# Patient Record
Sex: Female | Born: 1995 | Race: Black or African American | Hispanic: No | Smoking: Never smoker
Health system: Southern US, Community
[De-identification: ages and names within clinical notes are randomized; demographics above are authoritative.]

## PROBLEM LIST (undated history)

## (undated) DIAGNOSIS — E119 Type 2 diabetes mellitus without complications: Secondary | ICD-10-CM

## (undated) DIAGNOSIS — T7840XA Allergy, unspecified, initial encounter: Secondary | ICD-10-CM

## (undated) HISTORY — DX: Allergy, unspecified, initial encounter: T78.40XA

## (undated) HISTORY — PX: ADENOIDECTOMY: SHX5191

## (undated) HISTORY — DX: Type 2 diabetes mellitus without complications: E11.9

---
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
## 2013-10-31 ENCOUNTER — Ambulatory Visit: Payer: Medicaid Other | Attending: Internal Medicine | Admitting: Internal Medicine

## 2013-10-31 ENCOUNTER — Encounter: Payer: Self-pay | Admitting: Internal Medicine

## 2013-10-31 VITALS — BP 133/92 | HR 100 | Temp 98.3°F | Resp 15 | Ht 64.0 in | Wt 271.0 lb

## 2013-10-31 DIAGNOSIS — R030 Elevated blood-pressure reading, without diagnosis of hypertension: Secondary | ICD-10-CM | POA: Insufficient documentation

## 2013-10-31 DIAGNOSIS — N926 Irregular menstruation, unspecified: Secondary | ICD-10-CM | POA: Insufficient documentation

## 2013-10-31 DIAGNOSIS — IMO0001 Reserved for inherently not codable concepts without codable children: Secondary | ICD-10-CM

## 2013-10-31 DIAGNOSIS — E1159 Type 2 diabetes mellitus with other circulatory complications: Secondary | ICD-10-CM | POA: Insufficient documentation

## 2013-10-31 LAB — POCT GLYCOSYLATED HEMOGLOBIN (HGB A1C): Hemoglobin A1C: 6.9

## 2013-10-31 NOTE — Patient Instructions (Signed)
Obesity Obesity is defined as having too much total body fat and a body mass index (BMI) of 30 or more. BMI is an estimate of body fat and is calculated from your height and weight. Obesity happens when you consume more calories than you can burn by exercising or performing daily physical tasks. Prolonged obesity can cause major illnesses or emergencies, such as:   A stroke.  Heart disease.  Diabetes.  Cancer.  Arthritis.  High blood pressure (hypertension).  High cholesterol.  Sleep apnea.  Erectile dysfunction.  Infertility problems. CAUSES   Regularly eating unhealthy foods.  Physical inactivity.  Certain disorders, such as an underactive thyroid (hypothyroidism), Cushing's syndrome, and polycystic ovarian syndrome.  Certain medicines, such as steroids, some depression medicines, and antipsychotics.  Genetics.  Lack of sleep. DIAGNOSIS  A caregiver can diagnose obesity after calculating your BMI. Obesity will be diagnosed if your BMI is 30 or higher.  There are other methods of measuring obesity levels. Some other methods include measuring your skin fold thickness, your waist circumference, and comparing your hip circumference to your waist circumference. TREATMENT  A healthy treatment program includes some or all of the following:  Long-term dietary changes.  Exercise and physical activity.  Behavioral and lifestyle changes.  Medicine only under the supervision of your caregiver. Medicines may help, but only if they are used with diet and exercise programs. An unhealthy treatment program includes:  Fasting.  Fad diets.  Supplements and drugs. These choices do not succeed in long-term weight control.  HOME CARE INSTRUCTIONS   Exercise and perform physical activity as directed by your caregiver. To increase physical activity, try the following:  Use stairs instead of elevators.  Park farther away from store entrances.  Garden, bike, or walk instead of  watching television or using the computer.  Eat healthy, low-calorie foods and drinks on a regular basis. Eat more fruits and vegetables. Use low-calorie cookbooks or take healthy cooking classes.  Limit fast food, sweets, and processed snack foods.  Eat smaller portions.  Keep a daily journal of everything you eat. There are many free websites to help you with this. It may be helpful to measure your foods so you can determine if you are eating the correct portion sizes.  Avoid drinking alcohol. Drink more water and drinks without calories.  Take vitamins and supplements only as recommended by your caregiver.  Weight-loss support groups, Registered Dieticians, counselors, and stress reduction education can also be very helpful. SEEK IMMEDIATE MEDICAL CARE IF:  You have chest pain or tightness.  You have trouble breathing or feel short of breath.  You have weakness or leg numbness.  You feel confused or have trouble talking.  You have sudden changes in your vision. MAKE SURE YOU:  Understand these instructions.  Will watch your condition.  Will get help right away if you are not doing well or get worse. Document Released: 08/25/2004 Document Revised: 01/17/2012 Document Reviewed: 08/24/2011 ExitCare Patient Information 2014 [employer].  

## 2013-10-31 NOTE — Progress Notes (Signed)
Pt is here to establish care. Pt is requesting a full check up.

## 2013-11-01 ENCOUNTER — Encounter: Payer: Self-pay | Admitting: Internal Medicine

## 2013-11-01 LAB — LIPID PANEL
CHOLESTEROL: 110 mg/dL (ref 0–200)
HDL: 33 mg/dL — ABNORMAL LOW (ref >39)
LDL Cholesterol: 49 mg/dL (ref 0–99)
TRIGLYCERIDES: 138 mg/dL (ref <150)
Total CHOL/HDL Ratio: 3.3 Ratio
VLDL: 28 mg/dL (ref 0–40)

## 2013-11-01 LAB — URINALYSIS, COMPLETE
BACTERIA UA: NONE SEEN
Bilirubin Urine: NEGATIVE
CRYSTALS: NONE SEEN
Casts: NONE SEEN
GLUCOSE, UA: NEGATIVE mg/dL
KETONES UR: NEGATIVE mg/dL
Nitrite: NEGATIVE
PROTEIN: NEGATIVE mg/dL
SQUAMOUS EPITHELIAL / LPF: NONE SEEN
Specific Gravity, Urine: <1.005 — ABNORMAL LOW (ref 1.005–1.030)
Urobilinogen, UA: 0.2 mg/dL (ref 0.0–1.0)
pH: 6.5 (ref 5.0–8.0)

## 2013-11-01 LAB — COMPLETE METABOLIC PANEL WITH GFR
ALK PHOS: 63 U/L (ref 39–117)
ALT: 18 U/L (ref 0–35)
AST: 21 U/L (ref 0–37)
Albumin: 4.1 g/dL (ref 3.5–5.2)
BILIRUBIN TOTAL: 0.3 mg/dL (ref 0.2–1.1)
BUN: 8 mg/dL (ref 6–23)
CO2: 25 meq/L (ref 19–32)
CREATININE: 0.57 mg/dL (ref 0.50–1.10)
Calcium: 9.7 mg/dL (ref 8.4–10.5)
Chloride: 101 mEq/L (ref 96–112)
GFR, Est Non African American: >89 mL/min
Glucose, Bld: 125 mg/dL — ABNORMAL HIGH (ref 70–99)
Potassium: 4.3 mEq/L (ref 3.5–5.3)
SODIUM: 135 meq/L (ref 135–145)
TOTAL PROTEIN: 7.7 g/dL (ref 6.0–8.3)

## 2013-11-01 LAB — CBC WITH DIFFERENTIAL/PLATELET
BASOS ABS: 0 10*3/uL (ref 0.0–0.1)
BASOS PCT: 0 % (ref 0–1)
EOS PCT: 2 % (ref 0–5)
Eosinophils Absolute: 0.2 10*3/uL (ref 0.0–0.7)
HCT: 36.6 % (ref 36.0–46.0)
Hemoglobin: 12.4 g/dL (ref 12.0–15.0)
LYMPHS ABS: 2.8 10*3/uL (ref 0.7–4.0)
Lymphocytes Relative: 34 % (ref 12–46)
MCH: 23.2 pg — ABNORMAL LOW (ref 26.0–34.0)
MCHC: 33.9 g/dL (ref 30.0–36.0)
MCV: 68.5 fL — ABNORMAL LOW (ref 78.0–100.0)
Monocytes Absolute: 0.8 10*3/uL (ref 0.1–1.0)
Monocytes Relative: 10 % (ref 3–12)
NEUTROS PCT: 54 % (ref 43–77)
Neutro Abs: 4.5 10*3/uL (ref 1.7–7.7)
PLATELETS: 388 10*3/uL (ref 150–400)
RBC: 5.34 MIL/uL — AB (ref 3.87–5.11)
RDW: 15.7 % — AB (ref 11.5–15.5)
WBC: 8.3 10*3/uL (ref 4.0–10.5)

## 2013-11-01 LAB — TSH: TSH: 1.898 u[IU]/mL (ref 0.350–4.500)

## 2013-11-01 NOTE — Progress Notes (Signed)
Patient ID: Sharon Wagner, female   DOB: Dec 06, 1995, 18 y.o.   MRN: [id number]   Sharon Wagner, is a 18 y.o. female  [id number]  [id number]  DOB - Dec 06, 1995  CC:  Chief Complaint  Patient presents with  . Establish Care       HPI: Sharon Wagner is a 18 y.o. female here today to establish medical care. She was brought in to clinic today by her father who is a single parent, patient's mother died when she was 89 years old. Father wants her tested for diabetes and other medical conditions. She has no significant past medical history. Father is hypertensive and diabetic. She reports no concern except that she is overweight and she would like to lose weight. She does not know her menstrual history, according to the father she wears pad all the time saying just in case her menstrual period comes. She will occasionally see spotting, sometimes it's menstrual period, sometimes she sees this pattern twice in a month. She's not sexually active. She does not smoke cigarette and she does not drink alcohol. Patient has No headache, No chest pain, No abdominal pain - No Nausea, No new weakness tingling or numbness, No Cough - SOB.  No Known Allergies History reviewed. No pertinent past medical history. No current outpatient prescriptions on file prior to visit.   No current facility-administered medications on file prior to visit.   Family History  Problem Relation Age of Onset  . Diabetes Father   . Heart disease Father   . Hypertension Father   . Diabetes Paternal Grandmother   . Heart disease Paternal Grandfather    History   Social History  . Marital Status: Single    Spouse Name: N/A    Number of Children: N/A  . Years of Education: N/A   Occupational History  . Not on file.   Social History Main Topics  . Smoking status: Never Smoker   . Smokeless tobacco: Not on file  . Alcohol Use: No  . Drug Use: No  . Sexual Activity: No   Other Topics Concern  . Not on  file   Social History Narrative  . No narrative on file    Review of Systems: Constitutional: Negative for fever, chills, diaphoresis, activity change, appetite change and fatigue. HENT: Negative for ear pain, nosebleeds, congestion, facial swelling, rhinorrhea, neck pain, neck stiffness and ear discharge.  Eyes: Negative for pain, discharge, redness, itching and visual disturbance. Respiratory: Negative for cough, choking, chest tightness, shortness of breath, wheezing and stridor.  Cardiovascular: Negative for chest pain, palpitations and leg swelling. Gastrointestinal: Negative for abdominal distention. Genitourinary: Negative for dysuria, urgency, frequency, hematuria, flank pain, decreased urine volume, difficulty urinating and dyspareunia.  Musculoskeletal: Negative for back pain, joint swelling, arthralgia and gait problem. Neurological: Negative for dizziness, tremors, seizures, syncope, facial asymmetry, speech difficulty, weakness, light-headedness, numbness and headaches.  Hematological: Negative for adenopathy. Does not bruise/bleed easily. Psychiatric/Behavioral: Negative for hallucinations, behavioral problems, confusion, dysphoric mood, decreased concentration and agitation.    Objective:   Filed Vitals:   10/31/13 1511  BP: 133/92  Pulse:   Temp:   Resp:     Physical Exam: Constitutional: Patient appears well-developed and well-nourished. No distress. Morbidly obese HENT: Normocephalic, atraumatic, External right and left ear normal. Oropharynx is clear and moist.  Eyes: Conjunctivae and EOM are normal. PERRLA, no scleral icterus. Neck: Normal ROM. Neck supple. No JVD. No tracheal deviation. No thyromegaly. CVS: RRR, S1/S2 +, no murmurs, no gallops, no  carotid bruit.  Pulmonary: Effort and breath sounds normal, no stridor, rhonchi, wheezes, rales.  Abdominal: Soft. BS +, no distension, tenderness, rebound or guarding.  Musculoskeletal: Normal range of motion. No  edema and no tenderness.  Lymphadenopathy: No lymphadenopathy noted, cervical, inguinal or axillary Neuro: Alert. Normal reflexes, muscle tone coordination. No cranial nerve deficit. Skin: Skin is warm and dry. No rash noted. Not diaphoretic. No erythema. No pallor. Psychiatric: Normal mood and affect. Behavior, judgment, thought content normal.  Lab Results  Component Value Date   WBC 8.3 10/31/2013   HGB 12.4 10/31/2013   HCT 36.6 10/31/2013   MCV 68.5* 10/31/2013   PLT 388 10/31/2013   Lab Results  Component Value Date   CREATININE 0.57 10/31/2013   BUN 8 10/31/2013   NA 135 10/31/2013   K 4.3 10/31/2013   CL 101 10/31/2013   CO2 25 10/31/2013    Lab Results  Component Value Date   HGBA1C 6.9 10/31/2013   Lipid Panel     Component Value Date/Time   CHOL 110 10/31/2013 1449   TRIG 138 10/31/2013 1449   HDL 33* 10/31/2013 1449   CHOLHDL 3.3 10/31/2013 1449   VLDL 28 10/31/2013 1449   LDLCALC 49 10/31/2013 1449       Assessment and plan:   1. Elevated blood pressure Repeat blood pressure is 133/92 mmHg Patient has been instructed to record ambulatory blood pressure at least 3 times a week and bring to the next visit DASH diet The patient was counseled extensively about nutrition and exercise  2. Morbid obesity  - CBC with Differential - COMPLETE METABOLIC PANEL WITH GFR - POCT glycosylated hemoglobin (Hb A1C) - Lipid panel - TSH - Urinalysis, Complete  3. Irregular menstrual cycle Patient was given instruction to record her menstrual period from now on and bring to the next visit   Return in about 2 months (around 12/31/2013), or if symptoms worsen or fail to improve, for BP Check, Nurse Visit.  The patient was given clear instructions to go to ER or return to medical center if symptoms don't improve, worsen or new problems develop. The patient verbalized understanding. The patient was told to call to get lab results if they haven't heard anything in the next week.     This note has been  created with Education officer, environmental. Any transcriptional errors are unintentional.    Jeanann Lewandowsky, MD, MHA, FACP, FAAP [facility] [place], [place] [phone]   11/01/2013, 1:45 PM

## 2013-11-07 ENCOUNTER — Telehealth: Payer: Self-pay | Admitting: Emergency Medicine

## 2013-11-07 NOTE — Telephone Encounter (Signed)
Message copied by Darlis LoanSMITH, JILL D on Thu Nov 07, 2013 10:06 AM ------      Message from: Jeanann LewandowskyJEGEDE, OLUGBEMIGA E      Created: Wed Nov 06, 2013  3:11 PM       Please inform patient that her hemoglobin A1c is 6.9% which shows that she is diabetic. All other tests are mostly within normal limits      Requires that she comes to the clinic for diabetic instructions and medication. She can do this as a walk-in during this week ------

## 2013-11-07 NOTE — Telephone Encounter (Signed)
Pt and father informed of new diagnosis of diabetes Scheduled pt for nurse visit 11/13/13 @ 2pm

## 2013-11-07 NOTE — Telephone Encounter (Signed)
Left message for pt to call clinic to come in as walk in for new diabetes education with medication

## 2013-11-07 NOTE — Telephone Encounter (Signed)
Message copied by Darlis LoanSMITH, Jeremi Losito D on Thu Nov 07, 2013  3:19 PM ------      Message from: Jeanann LewandowskyJEGEDE, OLUGBEMIGA E      Created: Wed Nov 06, 2013  3:11 PM       Please inform patient that her hemoglobin A1c is 6.9% which shows that she is diabetic. All other tests are mostly within normal limits      Requires that she comes to the clinic for diabetic instructions and medication. She can do this as a walk-in during this week ------

## 2013-11-07 NOTE — Telephone Encounter (Signed)
Left message #2 for pt to call when received message

## 2013-11-13 ENCOUNTER — Ambulatory Visit: Payer: Medicaid Other | Attending: Internal Medicine | Admitting: Pharmacist

## 2013-11-13 ENCOUNTER — Encounter: Payer: Self-pay | Admitting: Pharmacist

## 2013-11-13 VITALS — BP 118/74 | HR 99 | Temp 98.5°F | Ht 64.0 in | Wt 266.8 lb

## 2013-11-13 DIAGNOSIS — E119 Type 2 diabetes mellitus without complications: Secondary | ICD-10-CM

## 2013-11-13 LAB — GLUCOSE, POCT (MANUAL RESULT ENTRY): POC Glucose: 110 mg/dl — AB (ref 70–99)

## 2013-11-13 MED ORDER — METFORMIN HCL 500 MG PO TABS: 500.0000 mg | ORAL_TABLET | Freq: Two times a day (BID) | ORAL | Status: DC

## 2013-11-13 MED ORDER — ACCU-CHEK AVIVA PLUS W/DEVICE KIT: PACK | Status: DC

## 2013-11-13 MED ORDER — GLUCOSE BLOOD VI STRP: ORAL_STRIP | Status: DC

## 2013-11-13 MED ORDER — ACCU-CHEK SOFTCLIX LANCETS MISC: Status: DC

## 2013-11-13 NOTE — Progress Notes (Unsigned)
S:    Patient arrives with father for diabetes followup.  She was diagnosed last week with diabetes. Patient has taken no medication at this time.   O:  . Lab Results  Component Value Date   HGBA1C 6.9 10/31/2013     home fasting CBG readings of N/A 2 hour post-prandial/random CBG readings of  N/A CBG in office: 110   A/P: Diabetes: Today we discussed what her A1c value meant.  She was also shown how to use a meter and test her blood sugars.  She was given a log book to track her blood sugars each day.   Referral was sent for diabetes education class.  Blood Pressure: BP was a goal today.   1.  Pt started on Metformin 500 mg BID and counseled on side effects. 2. Pt to check blood sugars twice daily.  Rx for meter and supplies sent to pharmacy. 3.  Will follow up with pt in 2 weeks to review blood sugar readings.

## 2013-11-26 ENCOUNTER — Ambulatory Visit: Payer: Medicaid Other | Admitting: Pharmacist

## 2014-01-01 ENCOUNTER — Encounter: Payer: Medicaid Other | Attending: Internal Medicine | Admitting: *Deleted

## 2014-01-01 ENCOUNTER — Encounter: Payer: Self-pay | Admitting: *Deleted

## 2014-01-01 VITALS — Ht 66.0 in | Wt 263.6 lb

## 2014-01-01 DIAGNOSIS — E119 Type 2 diabetes mellitus without complications: Secondary | ICD-10-CM | POA: Insufficient documentation

## 2014-01-01 DIAGNOSIS — Z713 Dietary counseling and surveillance: Secondary | ICD-10-CM | POA: Insufficient documentation

## 2014-01-01 NOTE — Progress Notes (Signed)
Appt start time: 1400 end time:  1530.   Assessment:  Patient was seen on  01/01/14 for individual diabetes education. She is accompanied by her father. She lives with her father, Step-mother and step-sister. She works at [employer] on [employer], [employer] and utilizes the [employer] for transportation. She is 18 yo with diminished mental capacity. She goes to the [employer] with her family Melvyn Novas. Much of the education was directed at her father.  Current HbA1c: 6.9%  Preferred Learning Style:   No preference indicated   Learning Readiness:   Ready  MEDICATIONS: Metformin  DIETARY INTAKE:  B ( AM): sausage, egg, bacon, dry cereal, bagel strawberry cheese  Snk ( AM): none  L ( PM): [employer]..chicken strips, chicken sandwich Snk ( PM): chips D ( PM): spaghetti with sauce, cheese, ranch dressing Snk ( PM): none Beverages: water, regular sprite, fruit punch, sweet Tea  Usual physical activity: Goes to [employer] 2 X weekly, swim, walk, treadmill, bicycle  Intervention:  Nutrition counseling provided.  Discussed diabetes disease process and treatment options.  Discussed physiology of diabetes and role of obesity on insulin resistance.  Encouraged moderate weight reduction to improve glucose levels.  Discussed role of medications and diet in glucose control  Provided education on macronutrients on glucose levels.  Provided education on carb counting, importance of regularly scheduled meals/snacks, and meal planning  Discussed effects of physical activity on glucose levels and long-term glucose control.  Recommended 150 minutes of physical activity/week.  Reviewed patient medications.  Discussed role of medication on blood glucose and possible side effects  Discussed blood glucose monitoring and interpretation.  Discussed recommended target ranges and individual ranges.    Described short-term complications: hyper- and hypo-glycemia.  Discussed causes,symptoms, and  treatment options.  Discussed prevention, detection, and treatment of long-term complications.  Discussed the role of prolonged elevated glucose levels on body systems.  Discussed role of stress on blood glucose levels and discussed strategies to manage psychosocial issues.  Discussed recommendations for long-term diabetes self-care.  Established checklist for medical, dental, and emotional self-care.  Plan:  Aim for 3 Carb Choices per meal (45 grams) +/- 1 either way  Aim for 0-1 Carbs per snack if hungry  Include protein in moderation with your meals and snacks Consider reading food labels for Total Carbohydrate and Fat Grams of foods Consider  increasing your activity level by going to the [employer] 5 times per week for at least 30 minutes daily as tolerated Continue checking BG at alternate times per day to include fasting and 2 hours after dinner as directed by MD  Continue taking medication as directed by MD  Weigh yourself the same day each week, first thing in the morning with no clothes on. Decrease the bagels - too much sugar Switch to diet soda Switch to TEA sweetened with Splenda  Teaching Method Utilized:  Visual Auditory Hands on  Handouts given during visit include: Meal Plan Card My Plate  Barriers to learning/adherence to lifestyle change: diminished mental capacity  Diabetes self-care support plan:   [facility] support group  family  Demonstrated degree of understanding via:  Teach Back   Monitoring/Evaluation:  Dietary intake, exercise, test glucose, and body weight in 4 weeks with dietitian.

## 2014-01-01 NOTE — Patient Instructions (Addendum)
Plan:  Aim for 3 Carb Choices per meal (45 grams) +/- 1 either way  Aim for 0-1 Carbs per snack if hungry  Include protein in moderation with your meals and snacks Consider reading food labels for Total Carbohydrate and Fat Grams of foods Consider  increasing your activity level by going to [facility] [employer] 5 times per week for at least 30 minutes daily as tolerated Continue checking BG at alternate times per day to include fasting and 2 hours after dinner as directed by MD  Continue taking medication as directed by MD  Weigh yourself the same day each week, first thing in the morning with no clothes on. Decrease the bagels - too much sugar Switch to diet soda Switch to TEA sweetened with Splenda

## 2014-01-07 ENCOUNTER — Encounter: Payer: Self-pay | Admitting: *Deleted

## 2014-01-07 NOTE — Progress Notes (Signed)
Fax received from [employer] in regards to Medicare Part B detailed written order for Accu-check avia plus test strips and Accu-check Softclix Lancets. Forms placed in Dr. Louis Meckel box to be filled out and returned to Mariana Single, Charity fundraiser. Reather Laurence, RN

## 2014-01-13 ENCOUNTER — Ambulatory Visit: Payer: Medicaid Other | Admitting: Internal Medicine

## 2014-02-18 ENCOUNTER — Ambulatory Visit: Payer: Medicaid Other | Admitting: Dietician

## 2014-03-04 ENCOUNTER — Encounter: Payer: Medicaid Other | Attending: Internal Medicine | Admitting: Dietician

## 2014-03-04 VITALS — Ht 66.0 in | Wt 260.4 lb

## 2014-03-04 DIAGNOSIS — E119 Type 2 diabetes mellitus without complications: Secondary | ICD-10-CM | POA: Diagnosis not present

## 2014-03-04 DIAGNOSIS — Z713 Dietary counseling and surveillance: Secondary | ICD-10-CM | POA: Insufficient documentation

## 2014-03-04 NOTE — Patient Instructions (Addendum)
Plan:  Aim for 3 Carb Choices per meal (45 grams) +/- 1 either way  Aim for 0-1 Carbs per snack if hungry  Include protein in moderation with your meals and snacks Consider reading food labels for Total Carbohydrate and Fat Grams of foods Consider  increasing your activity level by going to [facility] [employer] 5 times per week for at least 30 minutes daily as tolerated Continue checking BG at alternate times per day to include fasting and 2 hours after dinner as directed by MD   Weigh yourself the same day each week, first thing in the morning with no clothes on. Decrease the bagels - too much sugar Switch to diet soda Switch to TEA sweetened with Splenda   -Increase non-starchy vegetable intake: squash, zucchini, collard greens, onions, peppers -Have 1/2 Twister drink per day (or less!) -Choose snacks with a carb and a protein

## 2014-03-04 NOTE — Progress Notes (Signed)
Appt start time: 230 end time:  300.  Follow-up: Sharon FoundsKadijah returns today with her father. She has lost 4 pounds since last visit 2 months ago. However, her father reports that at one point she was 248 lbs. She has her log book with her today and blood sugars have been good, averaging 90-135 mg/dL. She had a few readings in the 180's and attributes this to too many carbohydrates. No current HgbA1c available.  Preferred Learning Style:   No preference indicated   Learning Readiness:   Ready  MEDICATIONS: Metformin  DIETARY INTAKE:  B ( AM): chicken patty with Bo rounds and water Snk ( AM): none  L ( PM): Hamburger Snk ( PM): honeybun or brownie D ( PM): hamburger on bun with cheese and ketchup with chips Snk ( PM): none  Beverages: water, fruit punch  Usual physical activity: [facility] [employer] [facility], swim, walk, treadmill, bicycle  Intervention:  Nutrition counseling provided.  Reviewed effect of macronutrients on glucose levels.   Practiced carbohydrate counting.  Encouraged snacks with 1 carb and 1 protein  Teaching Method Utilized:  Visual Auditory Hands on  Handouts given during visit include: 15g CHO + protein snacks  Barriers to learning/adherence to lifestyle change: diminished mental capacity  Diabetes self-care support plan:   [facility] support group  family  Demonstrated degree of understanding via:  Teach Back   Monitoring/Evaluation:  Dietary intake, exercise, test glucose, and body weight in 8 weeks with dietitian.

## 2014-03-20 ENCOUNTER — Other Ambulatory Visit: Payer: Self-pay | Admitting: Internal Medicine

## 2014-05-06 ENCOUNTER — Ambulatory Visit: Payer: Medicaid Other | Admitting: Dietician

## 2014-10-01 ENCOUNTER — Other Ambulatory Visit: Payer: Self-pay | Admitting: Internal Medicine

## 2014-10-17 ENCOUNTER — Other Ambulatory Visit: Payer: Self-pay | Admitting: Internal Medicine

## 2014-11-27 ENCOUNTER — Other Ambulatory Visit (INDEPENDENT_AMBULATORY_CARE_PROVIDER_SITE_OTHER): Payer: Medicare Other

## 2014-11-27 ENCOUNTER — Encounter: Payer: Self-pay | Admitting: Family

## 2014-11-27 ENCOUNTER — Ambulatory Visit (INDEPENDENT_AMBULATORY_CARE_PROVIDER_SITE_OTHER): Payer: Medicare Other | Admitting: Family

## 2014-11-27 VITALS — BP 110/70 | HR 87 | Temp 98.0°F | Resp 18 | Ht 64.0 in | Wt 275.0 lb

## 2014-11-27 DIAGNOSIS — E119 Type 2 diabetes mellitus without complications: Secondary | ICD-10-CM

## 2014-11-27 DIAGNOSIS — Z0000 Encounter for general adult medical examination without abnormal findings: Secondary | ICD-10-CM

## 2014-11-27 LAB — LIPID PANEL
CHOLESTEROL: 105 mg/dL (ref 0–200)
HDL: 32.5 mg/dL — AB (ref >39.00)
LDL CALC: 49 mg/dL (ref 0–99)
NonHDL: 72.5
Total CHOL/HDL Ratio: 3
Triglycerides: 120 mg/dL (ref 0.0–149.0)
VLDL: 24 mg/dL (ref 0.0–40.0)

## 2014-11-27 LAB — CBC
HCT: 36.7 % (ref 36.0–46.0)
HEMOGLOBIN: 12.3 g/dL (ref 12.0–15.0)
MCHC: 33.6 g/dL (ref 30.0–36.0)
MCV: 68.4 fl — ABNORMAL LOW (ref 78.0–100.0)
PLATELETS: 404 10*3/uL — AB (ref 150.0–400.0)
RBC: 5.36 Mil/uL — AB (ref 3.87–5.11)
RDW: 15.3 % — ABNORMAL HIGH (ref 11.5–14.6)
WBC: 8 10*3/uL (ref 4.5–10.5)

## 2014-11-27 LAB — HEMOGLOBIN A1C: Hgb A1c MFr Bld: 7.3 % — ABNORMAL HIGH (ref 4.6–6.5)

## 2014-11-27 LAB — BASIC METABOLIC PANEL
BUN: 8 mg/dL (ref 6–23)
CHLORIDE: 102 meq/L (ref 96–112)
CO2: 27 mEq/L (ref 19–32)
CREATININE: 0.7 mg/dL (ref 0.40–1.20)
Calcium: 9.3 mg/dL (ref 8.4–10.5)
GFR: 136.22 mL/min (ref >60.00)
Glucose, Bld: 106 mg/dL — ABNORMAL HIGH (ref 70–99)
Potassium: 3.9 mEq/L (ref 3.5–5.1)
Sodium: 135 mEq/L (ref 135–145)

## 2014-11-27 LAB — GLUCOSE, POCT (MANUAL RESULT ENTRY): POC Glucose: 106 mg/dl — AB (ref 70–99)

## 2014-11-27 LAB — TSH: TSH: 8.23 u[IU]/mL — ABNORMAL HIGH (ref 0.35–5.50)

## 2014-11-27 MED ORDER — METFORMIN HCL 500 MG PO TABS: 500.0000 mg | ORAL_TABLET | Freq: Two times a day (BID) | ORAL | Status: DC

## 2014-11-27 NOTE — Assessment & Plan Note (Signed)
Obtain A1c and refill metformin.

## 2014-11-27 NOTE — Progress Notes (Signed)
Subjective:    Patient ID: Sharon Wagner, female    DOB: 06-17-1996, 19 y.o.   MRN: [id number]  Chief Complaint  Patient presents with  . Establish Care    CPE, needs a refill on metformin    HPI:  Sharon Wagner is a 19 y.o. female who presents today for an annual wellness visit.   1) Health Maintenance -   Diet - Averages 2-3 meals per day; fast food, some fruits and vegetables; Caffeine about 2-3 times per day.   Exercise - No structured exercise   2) Preventative Exams / Immunizations:  Dental -- Due for an exam  Vision -- Up to date   Health Maintenance  Topic Date Due  . PNEUMOCOCCAL POLYSACCHARIDE VACCINE (1) 03/11/1996  . FOOT EXAM  03/11/2004  . OPHTHALMOLOGY EXAM  03/11/2004  . URINE MICROALBUMIN  03/11/2004  . HIV Screening  03/11/2009  . PAP SMEAR  03/11/2012  . TETANUS/TDAP  03/11/2013  . HEMOGLOBIN A1C  05/02/2014  . INFLUENZA VACCINE  03/02/2015    There is no immunization history on file for this patient.   Review of Systems  Constitutional: Denies fever, chills, fatigue, or significant weight gain/loss. HENT: Head: Denies headache or neck pain Ears: Denies changes in hearing, ringing in ears, earache, drainage Nose: Denies discharge, stuffiness, itching, nosebleed, sinus pain Throat: Denies sore throat, hoarseness, dry mouth, sores, thrush Eyes: Denies loss/changes in vision, pain, redness, blurry/double vision, flashing lights Cardiovascular: Denies chest pain/discomfort, tightness, palpitations, shortness of breath with activity, difficulty lying down, swelling, sudden awakening with shortness of breath Respiratory: Denies shortness of breath, cough, sputum production, wheezing Gastrointestinal: Denies dysphasia, heartburn, change in appetite, nausea, change in bowel habits, rectal bleeding, constipation, diarrhea, yellow skin or eyes Genitourinary: Denies frequency, urgency, burning/pain, blood in urine, incontinence, change in  urinary strength. Musculoskeletal: Denies muscle/joint pain, stiffness, back pain, redness or swelling of joints, trauma Skin: Denies rashes, lumps, itching, dryness, color changes, or hair/nail changes Neurological: Denies dizziness, fainting, seizures, weakness, numbness, tingling, tremor Psychiatric - Denies nervousness, stress, depression or memory loss Endocrine: Denies heat or cold intolerance, sweating, frequent urination, excessive thirst, changes in appetite Hematologic: Denies ease of bruising or bleeding     Objective:     BP 110/70 mmHg  Pulse 87  Temp(Src) 98 F (36.7 C) (Oral)  Resp 18  Ht 5\' 4"  (1.626 m)  Wt 275 lb (124.739 kg)  BMI 47.18 kg/m2  SpO2 99% Nursing note and vital signs reviewed.  Physical Exam  Constitutional: She is oriented to person, place, and time. She appears well-developed and well-nourished.  Obese female seated in the chair, dressed appropriately for the situation and appears her stated age.   HENT:  Head: Normocephalic.  Right Ear: Hearing, tympanic membrane, external ear and ear canal normal.  Left Ear: Hearing, tympanic membrane, external ear and ear canal normal.  Nose: Nose normal.  Mouth/Throat: Uvula is midline, oropharynx is clear and moist and mucous membranes are normal.  Eyes: Conjunctivae and EOM are normal. Pupils are equal, round, and reactive to light.  Neck: Neck supple. No JVD present. No tracheal deviation present. No thyromegaly present.  Cardiovascular: Normal rate, regular rhythm, normal heart sounds and intact distal pulses.   Pulmonary/Chest: Effort normal and breath sounds normal.  Abdominal: Soft. Bowel sounds are normal. She exhibits no distension and no mass. There is no tenderness. There is no rebound and no guarding.  Musculoskeletal: Normal range of motion. She exhibits no edema or tenderness.  Lymphadenopathy:    She has no cervical adenopathy.  Neurological: She is alert and oriented to person, place, and  time. She has normal reflexes. No cranial nerve deficit. She exhibits normal muscle tone. Coordination normal.  Skin: Skin is warm and dry.  Psychiatric: She has a normal mood and affect. Her behavior is normal. Judgment and thought content normal.       Assessment & Plan:

## 2014-11-27 NOTE — Patient Instructions (Signed)
Thank you for choosing [facility].  Summary/Instructions:  Your prescription(s) have been submitted to your pharmacy or been printed and provided for you. Please take as directed and contact our office if you believe you are having problem(s) with the medication(s) or have any questions.  Please stop by the lab on the basement level of the building for your blood work. Your results will be released to [place] (or called to you) after review, usually within 72 hours after test completion. If any changes need to be made, you will be notified at that same time.  If your symptoms worsen or fail to improve, please contact our office for further instruction, or in case of emergency go directly to the emergency room at the closest medical facility.      Health Maintenance - 60-54 Years Old SCHOOL PERFORMANCE After high school, you may attend college or technical or vocational school, enroll in the [employer], or enter the workforce. PHYSICAL, SOCIAL, AND EMOTIONAL DEVELOPMENT  One hour of regular physical activity daily is recommended. Continue to participate in sports.  Develop your own interests and consider community service or volunteerism.  Make decisions about college and work plans.  Throughout these years, you should assume responsibility for your own health care. Increasing independence is important for you.  You may be exploring your sexual identity. Understand that you should never be in a situation that makes you feel uncomfortable, and tell your partner if you do not want to engage in sexual activity.  Body image may become important to you. Be mindful that eating disorders can develop at this time. Talk to your parents or other caregivers if you have concerns about body image, weight gain, or losing weight.  You may notice mood disturbances, depression, anxiety, attention problems, or trouble with alcohol. Talk to your health care provider if you have concerns about mental  illness.  Set limits for yourself and talk with your parents or other caregivers about independent decision making.  Handle conflict without physical violence.  Avoid loud noises which may impair hearing.  Limit television and computer time to 2 hours each day. Individuals who engage in excessive inactivity are more likely to become overweight. RECOMMENDED IMMUNIZATIONS  Influenza vaccine.  All adults should be immunized every year.  All adults, including pregnant women and people with hives-only allergy to eggs, can receive the inactivated influenza (IIV) vaccine.  Adults aged 18-49 years can receive the recombinant influenza (RIV) vaccine. The RIV vaccine does not contain any egg protein.  Tetanus, diphtheria, and acellular pertussis (Td, Tdap) vaccine.  Pregnant women should receive 1 dose of Tdap vaccine during each pregnancy. The dose should be obtained regardless of the length of time since the last dose. Immunization is preferred during the 27th to 36th week of gestation.  An adult who has not previously received Tdap or who does not know his or her vaccine status should receive 1 dose of Tdap. This initial dose should be followed by tetanus and diphtheria toxoids (Td) booster doses every 10 years.  Adults with an unknown or incomplete history of completing a 3-dose immunization series with Td-containing vaccines should begin or complete a primary immunization series including a Tdap dose.  Adults should receive a Td booster every 10 years.  Varicella vaccine.  An adult without evidence of immunity to varicella should receive 2 doses or a second dose if he or she has previously received 1 dose.  Pregnant females who do not have evidence of immunity should receive  the first dose after pregnancy. This first dose should be obtained before leaving the health care facility. The second dose should be obtained 4-8 weeks after the first dose.  Human papillomavirus (HPV)  vaccine.  Females aged 13-26 years who have not received the vaccine previously should obtain the 3-dose series.  The vaccine is not recommended for pregnant females. However, pregnancy testing is not needed before receiving a dose. If a female is found to be pregnant after receiving a dose, no treatment is needed. In that case, the remaining doses should be delayed until after the pregnancy.  Males aged 68-21 years who have not received the vaccine previously should receive the 3-dose series. Males aged 22-26 years may be immunized.  Immunization is recommended through the age of 65 years for any female who has sex with males and did not get any or all doses earlier.  Immunization is recommended for any person with an immunocompromised condition through the age of 21 years if he or she did not get any or all doses earlier.  During the 3-dose series, the second dose should be obtained 4-8 weeks after the first dose. The third dose should be obtained 24 weeks after the first dose and 16 weeks after the second dose.  Measles, mumps, and rubella (MMR) vaccine.  Adults born in [birth year] or later should have 1 or more doses of MMR vaccine unless there is a contraindication to the vaccine or there is laboratory evidence of immunity to each of the three diseases.  A routine second dose of MMR vaccine should be obtained at least 28 days after the first dose for students attending postsecondary schools, health care workers, and international travelers.  For females of childbearing age, rubella immunity should be determined. If there is no evidence of immunity, females who are not pregnant should be vaccinated. If there is no evidence of immunity, females who are pregnant should delay immunization until after pregnancy.  Pneumococcal 13-valent conjugate (PCV13) vaccine.  When indicated, a person who is uncertain of his or her immunization history and has no record of immunization should receive the PCV13  vaccine.  An adult aged 66 years or older who has certain medical conditions and has not been previously immunized should receive 1 dose of PCV13 vaccine. This PCV13 should be followed with a dose of pneumococcal polysaccharide (PPSV23) vaccine. The PPSV23 vaccine dose should be obtained at least 8 weeks after the dose of PCV13 vaccine.  An adult aged 92 years or older who has certain medical conditions and previously received 1 or more doses of PPSV23 vaccine should receive 1 dose of PCV13. The PCV13 vaccine dose should be obtained 1 or more years after the last PPSV23 vaccine dose.  Pneumococcal polysaccharide (PPSV23) vaccine.  When PCV13 is also indicated, PCV13 should be obtained first.  An adult younger than age 7 years who has certain medical conditions should be immunized.  Any person who resides in a long-term care facility should be immunized.  An adult smoker should be immunized.  People with an immunocompromised condition and certain other conditions should receive both PCV13 and PPSV23 vaccines.  People with human immunodeficiency virus (HIV) infection should be immunized as soon as possible after diagnosis.  Immunization during chemotherapy or radiation therapy should be avoided.  Routine use of PPSV23 vaccine is not recommended for American Indians, [place] Natives, or people younger than 65 years unless there are medical conditions that require PPSV23 vaccine.  When indicated, people who have unknown immunization  and have no record of immunization should receive PPSV23 vaccine.  One-time revaccination 5 years after the first dose of PPSV23 is recommended for people aged 19-64 years who have chronic kidney failure, nephrotic syndrome, asplenia, or immunocompromised conditions.  Meningococcal vaccine.  Adults with asplenia or persistent complement component deficiencies should receive 2 doses of quadrivalent meningococcal conjugate (MenACWY-D) vaccine. The doses should be  obtained at least 2 months apart.  Microbiologists working with certain meningococcal bacteria, [employer] recruits, people at risk during an outbreak, and people who travel to or live in countries with a high rate of meningitis should be immunized.  A first-year college student up through age 81 years who is living in a residence hall should receive a dose if he or she did not receive a dose on or after his or her 16th birthday.  Adults who have certain high-risk conditions should receive one or more doses of vaccine.  Hepatitis A vaccine.  Adults who wish to be protected from this disease, have certain high-risk conditions, work with hepatitis A-infected animals, work in hepatitis A research labs, or travel to or work in countries with a high rate of hepatitis A should be immunized.  Adults who were previously unvaccinated and who anticipate close contact with an international adoptee during the first 60 days after arrival in the [place] from a country with a high rate of hepatitis A should be immunized.  Hepatitis B vaccine.  Adults who wish to be protected from this disease, have certain high-risk conditions, may be exposed to blood or other infectious body fluids, are household contacts or sex partners of hepatitis B positive people, are clients or workers in certain care facilities, or travel to or work in countries with a high rate of hepatitis B should be immunized.  Haemophilus influenzae type b (Hib) vaccine.  A previously unvaccinated person with asplenia or sickle cell disease or having a scheduled splenectomy should receive 1 dose of Hib vaccine.  Regardless of previous immunization, a recipient of a hematopoietic stem cell transplant should receive a 3-dose series 6-12 months after his or her successful transplant.  Hib vaccine is not recommended for adults with HIV infection. TESTING  Annual screening for vision and hearing problems is recommended. Vision should be  screened at least once between 56-3 years of age.  You may be screened for anemia or tuberculosis.  You should have a blood test to check for high cholesterol.  You should be screened for alcohol and drug use.  If you are sexually active, you may be screened for sexually transmitted infections (STIs), pregnancy, or HIV. You should be screened for STIs if:  Your sexual activity has changed since the last screening test, and you are at an increased risk for chlamydia or gonorrhea. Ask your health care provider if you are at risk.  If you are at an increased risk for hepatitis B, you should be screened for this virus. You are considered at high risk for hepatitis B if you:  Were born in a country where hepatitis B occurs often. Talk with your health care provider about which countries are considered high risk.  Have parents who were born in a high-risk country and have not received a shot to protect against hepatitis B (hepatitis B vaccine).  Have HIV or AIDS.  Use needles to inject street drugs.  Live with or have sex with someone who has hepatitis B.  Are a man who has sex with other men (MSM).  Get  hemodialysis treatment.  Take certain medicines for conditions like cancer, organ transplantation, or autoimmune conditions. NUTRITION   You should:  Have three servings of low-fat milk and dairy products daily. If you do not drink milk or consume dairy products, you should eat calcium-enriched foods, such as juice, bread, or cereal. Dark, leafy greens or canned fish are alternate sources of calcium.  Drink plenty of water. Fruit juice should be limited to 8-12 oz (240-360 mL) each day. Sugary beverages and sodas should be avoided.  Avoid eating foods high in fat, salt, or sugar, such as chips, candy, and cookies.  Avoid fast foods and limit eating out at restaurants.  Try not to skip meals, especially breakfast. You should eat a variety of vegetables, fruits, and lean  meats.  Eat meals together as a family whenever possible. ORAL HEALTH Brush your teeth twice a day and floss at least once a day. You should have two dental exams a year.  SKIN CARE You should wear sunscreen when out in the sun. TALK TO SOMEONE ABOUT:  Precautions against pregnancy, contraception, and sexually transmitted infections.  Taking a prescription medicine daily to prevent HIV infection if you are at risk of being infected with HIV. This is called preexposure prophylaxis (PrEP). You are at risk if you:  Are a female who has sex with other males (MSM).  Are heterosexual and sexually active with more than one partner.  Take drugs by injection.  Are sexually active with a partner who has HIV.  Whether you are at high risk of being infected with HIV. If you choose to begin PrEP, you should first be tested for HIV. You should then be tested every 3 months for as long as you are taking PrEP.  Drug, tobacco, and alcohol use among your friends or at friends' homes. Smoking tobacco or marijuana and taking drugs have health consequences and may impact your brain development.  Appropriate use of over-the-counter or prescription medicines.  Driving guidelines and riding with friends.  The risks of drinking and driving or boating. Call someone if you have been drinking or using drugs and need a ride. WHAT'S NEXT? Visit your pediatrician or family physician once a year. By young adulthood, you should transition from your pediatrician to a family physician or internal medicine specialist. If you are a female and are sexually active, you may want to begin annual physical exams with a gynecologist. Document Released: 10/13/2006 Document Revised: 07/23/2013 Document Reviewed: 11/02/2006 [facility] Patient Information 2015 [place], [place]. This information is not intended to replace advice given to you by your health care provider. Make sure you discuss any questions you have with your health care  provider.

## 2014-11-27 NOTE — Progress Notes (Signed)
Pre visit review using our clinic review tool, if applicable. No additional management support is needed unless otherwise documented below in the visit note. 

## 2014-11-27 NOTE — Assessment & Plan Note (Signed)
1) Anticipatory Guidance: Discussed importance of wearing a seatbelt while driving and not texting while driving; changing batteries in smoke detector at least once annually; wearing suntan lotion when outside; eating a balanced and moderate diet; getting physical activity at least 30 minutes per day.  2) Immunizations / Screenings / Labs:  All immunizations are up-to-date per her recommendations. Patient is due for a dental exam which she will schedule independently. All other screenings are up-to-date per recommendations. Obtain CBC, BMET, Lipid profile and TSH.   Overall well exam. Patient has risk factors for cardiovascular disease including diabetes and morbid obesity. Diabetes is currently maintained with metformin and will obtain a current A1c. Will complete diabetic screens at next office visit. Discussed the importance of decreasing her evaluate through increased nutrient density and increased physical activity. Goal is to lose approximately 5-10% of her current body weight increasing physical activity to 30 minutes most days of the week. Discussed the importance of exercise and its effects on diabetes. Follow-up prevention exam in 1 year. Follow-up office visit pending A1c.

## 2014-11-29 ENCOUNTER — Telehealth: Payer: Self-pay | Admitting: Family

## 2014-11-29 DIAGNOSIS — R718 Other abnormality of red blood cells: Secondary | ICD-10-CM

## 2014-11-29 DIAGNOSIS — R7989 Other specified abnormal findings of blood chemistry: Secondary | ICD-10-CM

## 2014-11-29 NOTE — Telephone Encounter (Signed)
Please inform the patient that her A1c is slightly increased to 7.3 from 6.9. I am not going to make any changes to her medication, however I would like to emphasize the importance increasing her physical activity and we can recheck in 3 months to determine progress.  Her kidney function and electrolytes are normal.  Her cholesterol is fairly good. The only concern is her good cholesterol is slightly low at 32 with a goal of >39. This can be increased through diet by adding fish, walnuts, olive oil, or flaxseed to her diet. She can also consider taking a fish supplement (recommend [employer]).  Her white blood cells are normal, however her red blood cells are extremely small indicating most likely iron deficiency. Therefore I would like her to start taking ferrous sulfate 325 mg twice daily. This is available over the counter and again I recommend [employer] products. We will recheck her iron status in 3 months.  Lastly her thyroid test was elevated indicating potential hypothyroidism. I have placed additional labs for her to come and complete at her convenience. There is no fasting needed for these labs. This will confirm if she will need to be started on a thyroid hormone replacement.

## 2014-12-01 NOTE — Telephone Encounter (Signed)
LVM

## 2014-12-02 NOTE — Telephone Encounter (Signed)
Pt aware of lab results 

## 2014-12-02 NOTE — Telephone Encounter (Signed)
Pt father called and and would like a call back with results   Best number [phone]

## 2014-12-02 NOTE — Telephone Encounter (Signed)
Father called back.  Would like a call back in regards.

## 2014-12-26 ENCOUNTER — Telehealth: Payer: Self-pay | Admitting: Family

## 2014-12-26 ENCOUNTER — Other Ambulatory Visit (INDEPENDENT_AMBULATORY_CARE_PROVIDER_SITE_OTHER): Payer: Medicare Other

## 2014-12-26 DIAGNOSIS — R946 Abnormal results of thyroid function studies: Secondary | ICD-10-CM | POA: Diagnosis not present

## 2014-12-26 DIAGNOSIS — R7989 Other specified abnormal findings of blood chemistry: Secondary | ICD-10-CM

## 2014-12-26 DIAGNOSIS — R718 Other abnormality of red blood cells: Secondary | ICD-10-CM

## 2014-12-26 LAB — IBC PANEL
Iron: 42 ug/dL (ref 42–145)
SATURATION RATIOS: 11.7 % — AB (ref 20.0–50.0)
Transferrin: 256 mg/dL (ref 212.0–360.0)

## 2014-12-26 LAB — TSH: TSH: 5.55 u[IU]/mL — AB (ref 0.35–5.50)

## 2014-12-26 MED ORDER — LEVOTHYROXINE SODIUM 25 MCG PO TABS: 25.0000 ug | ORAL_TABLET | Freq: Every day | ORAL | Status: DC

## 2014-12-26 NOTE — Telephone Encounter (Signed)
Please inform the patient that her thyroid levels continue to remain high and I would like to start her on levothyroxine for hypothyroidism. We will send the medication to her pharmacy and have her return for follow up blood work in 6 weeks.

## 2014-12-27 LAB — T4: T4, Total: 5.2 ug/dL (ref 4.5–12.0)

## 2014-12-30 NOTE — Telephone Encounter (Signed)
Left detailed message of results below.

## 2015-01-23 ENCOUNTER — Other Ambulatory Visit: Payer: Self-pay

## 2015-01-23 DIAGNOSIS — E119 Type 2 diabetes mellitus without complications: Secondary | ICD-10-CM

## 2015-01-23 MED ORDER — METFORMIN HCL 500 MG PO TABS: 500.0000 mg | ORAL_TABLET | Freq: Two times a day (BID) | ORAL | Status: DC

## 2015-01-23 MED ORDER — LEVOTHYROXINE SODIUM 25 MCG PO TABS: 25.0000 ug | ORAL_TABLET | Freq: Every day | ORAL | Status: DC

## 2015-03-04 ENCOUNTER — Ambulatory Visit: Payer: Medicare Other | Admitting: Family

## 2015-03-30 ENCOUNTER — Other Ambulatory Visit: Payer: Self-pay

## 2015-03-30 MED ORDER — LEVOTHYROXINE SODIUM 25 MCG PO TABS: 25.0000 ug | ORAL_TABLET | Freq: Every day | ORAL | Status: DC

## 2015-04-03 ENCOUNTER — Telehealth: Payer: Self-pay | Admitting: *Deleted

## 2015-04-03 DIAGNOSIS — E119 Type 2 diabetes mellitus without complications: Secondary | ICD-10-CM

## 2015-04-03 MED ORDER — METFORMIN HCL 500 MG PO TABS: 500.0000 mg | ORAL_TABLET | Freq: Two times a day (BID) | ORAL | Status: DC

## 2015-04-03 MED ORDER — LEVOTHYROXINE SODIUM 25 MCG PO TABS: 25.0000 ug | ORAL_TABLET | Freq: Every day | ORAL | Status: DC

## 2015-04-03 NOTE — Telephone Encounter (Signed)
Receive call from pt father pt is needing refills on her metformin & levothyroxine. Verified pharmacy inform will send electronically to [facility]...Raechel Chute

## 2015-04-07 ENCOUNTER — Encounter: Payer: Self-pay | Admitting: Family

## 2015-04-07 ENCOUNTER — Ambulatory Visit (INDEPENDENT_AMBULATORY_CARE_PROVIDER_SITE_OTHER): Payer: Medicare Other | Admitting: Family

## 2015-04-07 ENCOUNTER — Encounter (INDEPENDENT_AMBULATORY_CARE_PROVIDER_SITE_OTHER): Payer: Medicare Other

## 2015-04-07 DIAGNOSIS — E119 Type 2 diabetes mellitus without complications: Secondary | ICD-10-CM

## 2015-04-07 DIAGNOSIS — E039 Hypothyroidism, unspecified: Secondary | ICD-10-CM | POA: Diagnosis not present

## 2015-04-07 DIAGNOSIS — Z23 Encounter for immunization: Secondary | ICD-10-CM | POA: Diagnosis not present

## 2015-04-07 LAB — HEMOGLOBIN A1C: Hgb A1c MFr Bld: 8.3 % — ABNORMAL HIGH (ref 4.6–6.5)

## 2015-04-07 LAB — GLUCOSE, POCT (MANUAL RESULT ENTRY): POC GLUCOSE: 143 mg/dL — AB (ref 70–99)

## 2015-04-07 LAB — TSH: TSH: 5.32 u[IU]/mL — ABNORMAL HIGH (ref 0.35–4.50)

## 2015-04-07 NOTE — Progress Notes (Signed)
Pre visit review using our clinic review tool, if applicable. No additional management support is needed unless otherwise documented below in the visit note. 

## 2015-04-07 NOTE — Patient Instructions (Signed)
Thank you for choosing [facility].  Summary/Instructions:  Please continue to take your medications as prescribed.   Please schedule an appointment for an eye exam.   Please stop by the lab on the basement level of the building for your blood work. Your results will be released to MyChart (or called to you) after review, usually within 72 hours after test completion. If any changes need to be made, you will be notified at that same time.  If your symptoms worsen or fail to improve, please contact our office for further instruction, or in case of emergency go directly to the emergency room at the closest medical facility.   Diabetes Mellitus and Food It is important for you to manage your blood sugar (glucose) level. Your blood glucose level can be greatly affected by what you eat. Eating healthier foods in the appropriate amounts throughout the day at about the same time each day will help you control your blood glucose level. It can also help slow or prevent worsening of your diabetes mellitus. Healthy eating may even help you improve the level of your blood pressure and reach or maintain a healthy weight.  HOW CAN FOOD AFFECT ME? Carbohydrates Carbohydrates affect your blood glucose level more than any other type of food. Your dietitian will help you determine how many carbohydrates to eat at each meal and teach you how to count carbohydrates. Counting carbohydrates is important to keep your blood glucose at a healthy level, especially if you are using insulin or taking certain medicines for diabetes mellitus. Alcohol Alcohol can cause sudden decreases in blood glucose (hypoglycemia), especially if you use insulin or take certain medicines for diabetes mellitus. Hypoglycemia can be a life-threatening condition. Symptoms of hypoglycemia (sleepiness, dizziness, and disorientation) are similar to symptoms of having too much alcohol.  If your health care provider has given you approval to  drink alcohol, do so in moderation and use the following guidelines:  Women should not have more than one drink per day, and men should not have more than two drinks per day. One drink is equal to:  12 oz of beer.  5 oz of wine.  1 oz of hard liquor.  Do not drink on an empty stomach.  Keep yourself hydrated. Have water, diet soda, or unsweetened iced tea.  Regular soda, juice, and other mixers might contain a lot of carbohydrates and should be counted. WHAT FOODS ARE NOT RECOMMENDED? As you make food choices, it is important to remember that all foods are not the same. Some foods have fewer nutrients per serving than other foods, even though they might have the same number of calories or carbohydrates. It is difficult to get your body what it needs when you eat foods with fewer nutrients. Examples of foods that you should avoid that are high in calories and carbohydrates but low in nutrients include:  Trans fats (most processed foods list trans fats on the Nutrition Facts label).  Regular soda.  Juice.  Candy.  Sweets, such as cake, pie, doughnuts, and cookies.  Fried foods. WHAT FOODS CAN I EAT? Have nutrient-rich foods, which will nourish your body and keep you healthy. The food you should eat also will depend on several factors, including:  The calories you need.  The medicines you take.  Your weight.  Your blood glucose level.  Your blood pressure level.  Your cholesterol level. You also should eat a variety of foods, including:  Protein, such as meat, poultry, fish, tofu, nuts,  and seeds (lean animal proteins are best).  Fruits.  Vegetables.  Dairy products, such as milk, cheese, and yogurt (low fat is best).  Breads, grains, pasta, cereal, rice, and beans.  Fats such as olive oil, trans fat-free margarine, canola oil, avocado, and olives. DOES EVERYONE WITH DIABETES MELLITUS HAVE THE SAME MEAL PLAN? Because every person with diabetes mellitus is  different, there is not one meal plan that works for everyone. It is very important that you meet with a dietitian who will help you create a meal plan that is just right for you. Document Released: 04/14/2005 Document Revised: 07/23/2013 Document Reviewed: 06/14/2013 [facility] Patient Information 2015 [place], [place]. This information is not intended to replace advice given to you by your health care provider. Make sure you discuss any questions you have with your health care provider.  Diabetes and Exercise Exercising regularly is important. It is not just about losing weight. It has many health benefits, such as:  Improving your overall fitness, flexibility, and endurance.  Increasing your bone density.  Helping with weight control.  Decreasing your body fat.  Increasing your muscle strength.  Reducing stress and tension.  Improving your overall health. People with diabetes who exercise gain additional benefits because exercise:  Reduces appetite.  Improves the body's use of blood sugar (glucose).  Helps lower or control blood glucose.  Decreases blood pressure.  Helps control blood lipids (such as cholesterol and triglycerides).  Improves the body's use of the hormone insulin by:  Increasing the body's insulin sensitivity.  Reducing the body's insulin needs.  Decreases the risk for heart disease because exercising:  Lowers cholesterol and triglycerides levels.  Increases the levels of good cholesterol (such as high-density lipoproteins [HDL]) in the body.  Lowers blood glucose levels. YOUR ACTIVITY PLAN  Choose an activity that you enjoy and set realistic goals. Your health care provider or diabetes educator can help you make an activity plan that works for you. Exercise regularly as directed by your health care provider. This includes:  Performing resistance training twice a week such as push-ups, sit-ups, lifting weights, or using resistance bands.  Performing  150 minutes of cardio exercises each week such as walking, running, or playing sports.  Staying active and spending no more than 90 minutes at one time being inactive. Even short bursts of exercise are good for you. Three 10-minute sessions spread throughout the day are just as beneficial as a single 30-minute session. Some exercise ideas include:  Taking the dog for a walk.  Taking the stairs instead of the elevator.  Dancing to your favorite song.  Doing an exercise video.  Doing your favorite exercise with a friend. RECOMMENDATIONS FOR EXERCISING WITH TYPE 1 OR TYPE 2 DIABETES   Check your blood glucose before exercising. If blood glucose levels are greater than 240 mg/dL, check for urine ketones. Do not exercise if ketones are present.  Avoid injecting insulin into areas of the body that are going to be exercised. For example, avoid injecting insulin into:  The arms when playing tennis.  The legs when jogging.  Keep a record of:  Food intake before and after you exercise.  Expected peak times of insulin action.  Blood glucose levels before and after you exercise.  The type and amount of exercise you have done.  Review your records with your health care provider. Your health care provider will help you to develop guidelines for adjusting food intake and insulin amounts before and after exercising.  If you  take insulin or oral hypoglycemic agents, watch for signs and symptoms of hypoglycemia. They include:  Dizziness.  Shaking.  Sweating.  Chills.  Confusion.  Drink plenty of water while you exercise to prevent dehydration or heat stroke. Body water is lost during exercise and must be replaced.  Talk to your health care provider before starting an exercise program to make sure it is safe for you. Remember, almost any type of activity is better than none. Document Released: 10/08/2003 Document Revised: 12/02/2013 Document Reviewed: 12/25/2012 [employer] [facility] [employer] [facility] Patient  Information 2015 [place], [place]. This information is not intended to replace advice given to you by your health care provider. Make sure you discuss any questions you have with your health care provider.  Exercise to Lose Weight Exercise and a healthy diet may help you lose weight. Your doctor may suggest specific exercises. EXERCISE IDEAS AND TIPS  Choose low-cost things you enjoy doing, such as walking, bicycling, or exercising to workout videos.  Take stairs instead of the elevator.  Walk during your lunch break.  Park your car further away from work or school.  Go to a gym or an exercise class.  Start with 5 to 10 minutes of exercise each day. Build up to 30 minutes of exercise 4 to 6 days a week.  Wear shoes with good support and comfortable clothes.  Stretch before and after working out.  Work out until you breathe harder and your heart beats faster.  Drink extra water when you exercise.  Do not do so much that you hurt yourself, feel dizzy, or get very short of breath. Exercises that burn about 150 calories:  Running 1  miles in 15 minutes.  Playing volleyball for 45 to 60 minutes.  Washing and waxing a car for 45 to 60 minutes.  Playing touch football for 45 minutes.  Walking 1  miles in 35 minutes.  Pushing a stroller 1  miles in 30 minutes.  Playing basketball for 30 minutes.  Raking leaves for 30 minutes.  Bicycling 5 miles in 30 minutes.  Walking 2 miles in 30 minutes.  Dancing for 30 minutes.  Shoveling snow for 15 minutes.  Swimming laps for 20 minutes.  Walking up stairs for 15 minutes.  Bicycling 4 miles in 15 minutes.  Gardening for 30 to 45 minutes.  Jumping rope for 15 minutes.  Washing windows or floors for 45 to 60 minutes. Document Released: 08/20/2010 Document Revised: 10/10/2011 Document Reviewed: 08/20/2010 [facility] Patient Information 2015 [place], [place]. This information is not intended to replace advice given to you by  your health care provider. Make sure you discuss any questions you have with your health care provider.

## 2015-04-07 NOTE — Progress Notes (Signed)
Subjective:    Patient ID: Sharon Wagner, female    DOB: 02-15-1996, 19 y.o.   MRN: [id number]  Chief Complaint  Patient presents with  . Follow-up    needs a1c check, does not check blood sugars, but does take medication as prescribed    HPI:  Sharon Wagner is a 19 y.o. female with a PMH of diabetes, obesity, and irregular menstral cycle who presents today for an office follow up.    1.) Type 2 diabetes - currently maintained on metformin. Questionably taking the medication as prescribed. Denies adverse side effects. Not currently exercising. Due for an eye exam.  Has gained 5 pounds since last visit. Notes she has a good amount of sweets in her diet.   Lab Results  Component Value Date   HGBA1C 7.3* 11/27/2014   2.) Hypothyroidism - Currently maintained on levothyroxine. Takes the medication as prescribed and denies adverse side effects.   Lab Results  Component Value Date   TSH 5.55* 12/26/2014    No Known Allergies   Current Outpatient Prescriptions on File Prior to Visit  Medication Sig Dispense Refill  . ACCU-CHEK SOFTCLIX LANCETS lancets Check blood sugars 2 times daily 100 each 12  . Blood Glucose Monitoring Suppl (ACCU-CHEK AVIVA PLUS) W/DEVICE KIT Check blood sugars 2 times daily. 1 kit 0  . glucose blood (ACCU-CHEK AVIVA) test strip Check blood sugars 2 times daily 100 each 12  . levothyroxine (SYNTHROID) 25 MCG tablet Take 1 tablet (25 mcg total) by mouth daily before breakfast. 30 tablet 0  . metFORMIN (GLUCOPHAGE) 500 MG tablet Take 1 tablet (500 mg total) by mouth 2 (two) times daily with a meal. 60 tablet 0   No current facility-administered medications on file prior to visit.    Review of Systems  Constitutional: Negative for fever and chills.  Respiratory: Negative for chest tightness and shortness of breath.   Cardiovascular: Negative for chest pain, palpitations and leg swelling.  Endocrine: Negative for cold intolerance, heat intolerance,  polydipsia, polyphagia and polyuria.      Objective:    BP 120/88 mmHg  Pulse 97  Temp(Src) 98.2 F (36.8 C) (Oral)  Resp 18  Ht 5' 4"  (1.626 m)  Wt 280 lb (127.007 kg)  BMI 48.04 kg/m2  SpO2 99% Nursing note and vital signs reviewed.  Physical Exam  Constitutional: She is oriented to person, place, and time. She appears well-developed and well-nourished. No distress.  Neck:  Acanthosis nigricans noted  Cardiovascular: Normal rate, regular rhythm, normal heart sounds and intact distal pulses.   Pulmonary/Chest: Effort normal and breath sounds normal.  Musculoskeletal:  Diabetic foot exam - bilateral feet are free from skin breakdown, cuts, and abrasions. Toenails are neatly trimmed. Pulses are intact and appropriate. Sensation is intact to monofilament bilaterally.  Neurological: She is alert and oriented to person, place, and time.  Skin: Skin is warm and dry.  Psychiatric: She has a normal mood and affect. Her behavior is normal. Judgment and thought content normal.       Assessment & Plan:   Problem List Items Addressed This Visit      Endocrine   Type 2 diabetes mellitus without complication    Diabetes remains labile with in office blood sugar of 143. Questionable compliance with medication regimen based on medication refill history. Obtain A1c to determine current status. Diabetic foot exam completed and normal. Due for diabetic eye exam which will be scheduled independently. Continue current dosage of metformin pending A1c results.  Relevant Orders   POCT glucose (manual entry) (Completed)   Hemoglobin A1c   Hypothyroidism    Hypothyroidism remained uncontrolled with current regimen of levothyroxine. Obtain TSH to determine current status. Continue current dosage of levothyroxine pending TSH results. Takes the medication as prescribed and denies adverse side effects.      Relevant Orders   TSH    Other Visit Diagnoses    Encounter for immunization

## 2015-04-07 NOTE — Assessment & Plan Note (Signed)
Hypothyroidism remained uncontrolled with current regimen of levothyroxine. Obtain TSH to determine current status. Continue current dosage of levothyroxine pending TSH results. Takes the medication as prescribed and denies adverse side effects.

## 2015-04-07 NOTE — Assessment & Plan Note (Signed)
Diabetes remains labile with in office blood sugar of 143. Questionable compliance with medication regimen based on medication refill history. Obtain A1c to determine current status. Diabetic foot exam completed and normal. Due for diabetic eye exam which will be scheduled independently. Continue current dosage of metformin pending A1c results.

## 2015-04-08 ENCOUNTER — Telehealth: Payer: Self-pay | Admitting: Family

## 2015-04-08 MED ORDER — GLIPIZIDE 5 MG PO TABS: 2.5000 mg | ORAL_TABLET | Freq: Every day | ORAL | Status: DC

## 2015-04-08 MED ORDER — LEVOTHYROXINE SODIUM 50 MCG PO TABS: 25.0000 ug | ORAL_TABLET | Freq: Every day | ORAL | Status: DC

## 2015-04-08 NOTE — Telephone Encounter (Signed)
Please inform patient that her thyroid continues to be elevated and therefore it is indicated to increase her levothyroxine to the next dose which has been sent to her pharmacy. Her A1c has worsened to 8.3 indicating worsened control of her diabetes. I would like to start her on a second medication called glipizide combines with changes in her diet and exercise. She does need to cut down on the sweets like we discussed during her visit. Please have her follow up in 6 weeks to recheck her TSH.

## 2015-07-06 ENCOUNTER — Other Ambulatory Visit: Payer: Self-pay | Admitting: Family

## 2015-08-25 ENCOUNTER — Other Ambulatory Visit: Payer: Self-pay

## 2015-08-25 MED ORDER — LEVOTHYROXINE SODIUM 25 MCG PO TABS: ORAL_TABLET | ORAL | Status: DC

## 2015-10-22 ENCOUNTER — Other Ambulatory Visit: Payer: Self-pay

## 2015-10-22 MED ORDER — METFORMIN HCL 500 MG PO TABS: 500.0000 mg | ORAL_TABLET | Freq: Two times a day (BID) | ORAL | Status: DC

## 2015-11-02 ENCOUNTER — Other Ambulatory Visit: Payer: Self-pay

## 2015-11-02 MED ORDER — LEVOTHYROXINE SODIUM 25 MCG PO TABS: ORAL_TABLET | ORAL | Status: DC

## 2015-11-12 ENCOUNTER — Telehealth: Payer: Self-pay | Admitting: Family

## 2015-11-12 MED ORDER — GLIPIZIDE 5 MG PO TABS: ORAL_TABLET | ORAL | Status: DC

## 2015-11-12 MED ORDER — METFORMIN HCL 500 MG PO TABS: 500.0000 mg | ORAL_TABLET | Freq: Two times a day (BID) | ORAL | Status: DC

## 2015-11-12 NOTE — Telephone Encounter (Signed)
Rx filled

## 2015-11-12 NOTE — Telephone Encounter (Signed)
Needs refill on glucontrol as well.

## 2015-11-12 NOTE — Telephone Encounter (Signed)
[facility] [address]

## 2016-04-29 ENCOUNTER — Other Ambulatory Visit: Payer: Self-pay | Admitting: Family

## 2016-07-10 ENCOUNTER — Other Ambulatory Visit: Payer: Self-pay | Admitting: Family

## 2016-07-27 ENCOUNTER — Other Ambulatory Visit: Payer: Self-pay | Admitting: Family

## 2016-12-28 ENCOUNTER — Other Ambulatory Visit: Payer: Self-pay | Admitting: Family

## 2017-03-30 ENCOUNTER — Other Ambulatory Visit: Payer: Self-pay | Admitting: Family

## 2018-01-19 ENCOUNTER — Encounter: Payer: Medicare Other | Admitting: Family Medicine

## 2018-01-19 DIAGNOSIS — Z0289 Encounter for other administrative examinations: Secondary | ICD-10-CM

## 2018-09-22 ENCOUNTER — Ambulatory Visit (HOSPITAL_COMMUNITY)
Admission: EM | Admit: 2018-09-22 | Discharge: 2018-09-22 | Disposition: A | Discharge status: Home / Self Care | Payer: Medicare Other | Attending: Internal Medicine | Admitting: Internal Medicine

## 2018-09-22 ENCOUNTER — Other Ambulatory Visit: Payer: Self-pay

## 2018-09-22 ENCOUNTER — Encounter (HOSPITAL_COMMUNITY): Payer: Self-pay

## 2018-09-22 DIAGNOSIS — E119 Type 2 diabetes mellitus without complications: Secondary | ICD-10-CM

## 2018-09-22 DIAGNOSIS — F17210 Nicotine dependence, cigarettes, uncomplicated: Secondary | ICD-10-CM | POA: Diagnosis not present

## 2018-09-22 DIAGNOSIS — R109 Unspecified abdominal pain: Secondary | ICD-10-CM | POA: Diagnosis not present

## 2018-09-22 DIAGNOSIS — E1165 Type 2 diabetes mellitus with hyperglycemia: Secondary | ICD-10-CM | POA: Diagnosis not present

## 2018-09-22 DIAGNOSIS — R350 Frequency of micturition: Secondary | ICD-10-CM

## 2018-09-22 DIAGNOSIS — Z9114 Patient's other noncompliance with medication regimen: Secondary | ICD-10-CM | POA: Diagnosis not present

## 2018-09-22 DIAGNOSIS — R110 Nausea: Secondary | ICD-10-CM

## 2018-09-22 LAB — BASIC METABOLIC PANEL
ANION GAP: 9 (ref 5–15)
BUN: 7 mg/dL (ref 6–20)
CALCIUM: 9.6 mg/dL (ref 8.9–10.3)
CHLORIDE: 101 mmol/L (ref 98–111)
CO2: 23 mmol/L (ref 22–32)
Creatinine, Ser: 0.61 mg/dL (ref 0.44–1.00)
GFR calc Af Amer: >60 mL/min (ref >60)
GFR calc non Af Amer: >60 mL/min (ref >60)
GLUCOSE: 287 mg/dL — AB (ref 70–99)
POTASSIUM: 4.2 mmol/L (ref 3.5–5.1)
Sodium: 133 mmol/L — ABNORMAL LOW (ref 135–145)

## 2018-09-22 LAB — CBC
HCT: 42.4 % (ref 36.0–46.0)
Hemoglobin: 14.2 g/dL (ref 12.0–15.0)
MCH: 22.8 pg — AB (ref 26.0–34.0)
MCHC: 33.5 g/dL (ref 30.0–36.0)
MCV: 68.1 fL — ABNORMAL LOW (ref 80.0–100.0)
Platelets: 406 10*3/uL — ABNORMAL HIGH (ref 150–400)
RBC: 6.23 MIL/uL — ABNORMAL HIGH (ref 3.87–5.11)
RDW: 14.2 % (ref 11.5–15.5)
WBC: 10.6 10*3/uL — ABNORMAL HIGH (ref 4.0–10.5)
nRBC: 0 % (ref 0.0–0.2)

## 2018-09-22 LAB — GLUCOSE, CAPILLARY: Glucose-Capillary: 286 mg/dL — ABNORMAL HIGH (ref 70–99)

## 2018-09-22 MED ORDER — LEVOTHYROXINE SODIUM 50 MCG PO TABS: 25.0000 ug | ORAL_TABLET | Freq: Every day | ORAL | 0 refills | Status: DC

## 2018-09-22 MED ORDER — ONDANSETRON HCL 4 MG PO TABS: 4.0000 mg | ORAL_TABLET | Freq: Three times a day (TID) | ORAL | 0 refills | Status: DC | PRN

## 2018-09-22 MED ORDER — GLIPIZIDE 5 MG PO TABS: 2.5000 mg | ORAL_TABLET | Freq: Every day | ORAL | 1 refills | Status: DC

## 2018-09-22 MED ORDER — LEVOTHYROXINE SODIUM 25 MCG PO TABS: 25.0000 ug | ORAL_TABLET | Freq: Every day | ORAL | 2 refills | Status: DC

## 2018-09-22 MED ORDER — METFORMIN HCL 500 MG PO TABS: 500.0000 mg | ORAL_TABLET | Freq: Two times a day (BID) | ORAL | 0 refills | Status: DC

## 2018-09-22 NOTE — ED Provider Notes (Signed)
[place]    CSN: [id number] Arrival date & time: 09/22/18  1402     History   Chief Complaint Chief Complaint  Patient presents with  . Nausea  . Chills    HPI Sharon Wagner is a 23 y.o. female with a history of morbid obesity, diabetes mellitus type 2 noncompliant with metformin comes to the urgent care with complaints of nausea,intermittent abdominal pain and increased urination.  Patient denies any dysuria urgency or frequency.  She is complaining of nausea without vomiting which started yesterday.  Appetite is fair.  No diarrhea or constipation.  No fever or chills.  No flank pain.  Patient complains of increased thirst.  Patient has run out of her prescription medications for over 3 weeks.  HPI  Past Medical History:  Diagnosis Date  . Allergy   . Diabetes mellitus without complication [facility])     Patient Active Problem List   Diagnosis Date Noted  . Hypothyroidism 04/07/2015  . Routine general medical examination at a health care facility 11/27/2014  . Type 2 diabetes mellitus without complication ([place]) 01/74/9449  . Elevated blood pressure 10/31/2013  . Morbid obesity ([place]) 10/31/2013  . Irregular menstrual cycle 10/31/2013    Past Surgical History:  Procedure Laterality Date  . ADENOIDECTOMY      OB History   No obstetric history on file.      Home Medications    Prior to Admission medications   Medication Sig Start Date End Date Taking? Authorizing Provider  ACCU-CHEK SOFTCLIX LANCETS lancets Check blood sugars 2 times daily 11/13/13   Angelica Chessman E, MD  Blood Glucose Monitoring Suppl (ACCU-CHEK AVIVA PLUS) W/DEVICE KIT Check blood sugars 2 times daily. 11/13/13   Tresa Garter, MD  glipiZIDE (GLUCOTROL) 5 MG tablet take 1/2 tablet by mouth once daily BEFORE BREAKFAST 07/11/16   Golden Circle, FNP  glucose blood (ACCU-CHEK AVIVA) test strip Check blood sugars 2 times daily 11/13/13   Tresa Garter, MD    levothyroxine (SYNTHROID, LEVOTHROID) 25 MCG tablet take 1 tablet by mouth once daily BEFORE BREAKFAST 07/11/16   Golden Circle, FNP  levothyroxine (SYNTHROID, LEVOTHROID) 50 MCG tablet Take 0.5 tablets (25 mcg total) by mouth daily before breakfast. 04/08/15   Golden Circle, FNP  metFORMIN (GLUCOPHAGE) 500 MG tablet Take 1 tablet (500 mg total) by mouth 2 (two) times daily with a meal. 11/12/15   Golden Circle, FNP  metFORMIN (GLUCOPHAGE) 500 MG tablet Take 1 tablet (500 mg total) by mouth 2 (two) times daily with a meal. ---patient needs appt before any further refills 07/27/16   Golden Circle, FNP    Family History Family History  Problem Relation Age of Onset  . Heart disease Paternal Grandfather   . Diabetes Father   . Heart disease Father   . Hypertension Father   . Diabetes Paternal Grandmother     Social History Social History   Tobacco Use  . Smoking status: Never Smoker  . Smokeless tobacco: Never Used  Substance Use Topics  . Alcohol use: No  . Drug use: No     Allergies   Patient has no known allergies.   Review of Systems Review of Systems  Constitutional: Positive for chills and fatigue. Negative for activity change, appetite change and fever.  HENT: Negative for congestion, ear discharge, ear pain, facial swelling, postnasal drip, sinus pressure and sinus pain.   Respiratory: Negative for cough, chest tightness, shortness of breath and wheezing.  Endocrine: Positive for polydipsia and polyuria. Negative for polyphagia.  Genitourinary: Negative for dysuria, frequency and urgency.  Allergic/Immunologic: Negative for environmental allergies and food allergies.  Neurological: Negative for dizziness, weakness, numbness and headaches.  Hematological: Negative for adenopathy. Does not bruise/bleed easily.     Physical Exam Triage Vital Signs ED Triage Vitals  Enc Vitals Group     BP 09/22/18 1448 (!) 151/99     Pulse Rate 09/22/18 1448 95      Resp 09/22/18 1448 18     Temp 09/22/18 1448 98.6 F (37 C)     Temp Source 09/22/18 1448 Oral     SpO2 09/22/18 1448 100 %     Weight --      Height --      Head Circumference --      Peak Flow --      Pain Score 09/22/18 1449 0     Pain Loc --      Pain Edu? --      Excl. in [place]? --    No data found.  Updated Vital Signs BP (!) 151/99 (BP Location: Left Arm)   Pulse 95   Temp 98.6 F (37 C) (Oral)   Resp 18   LMP 09/22/2018 (Exact Date)   SpO2 100%   Visual Acuity Right Eye Distance:   Left Eye Distance:   Bilateral Distance:    Right Eye Near:   Left Eye Near:    Bilateral Near:     Physical Exam Constitutional:      Appearance: Normal appearance.  HENT:     Mouth/Throat:     Mouth: Mucous membranes are moist.     Pharynx: No oropharyngeal exudate or posterior oropharyngeal erythema.  Neck:     Musculoskeletal: Normal range of motion. No neck rigidity.  Cardiovascular:     Rate and Rhythm: Normal rate and regular rhythm.     Pulses: Normal pulses.     Heart sounds: Normal heart sounds.  Pulmonary:     Effort: Pulmonary effort is normal.     Breath sounds: Normal breath sounds.  Abdominal:     General: Abdomen is flat. Bowel sounds are normal.     Palpations: Abdomen is soft.  Musculoskeletal: Normal range of motion.  Lymphadenopathy:     Cervical: No cervical adenopathy.  Skin:    General: Skin is warm.     Capillary Refill: Capillary refill takes less than 2 seconds.  Neurological:     General: No focal deficit present.     Mental Status: She is alert.      UC Treatments / Results  Labs (all labs ordered are listed, but only abnormal results are displayed) Labs Reviewed - No data to display  EKG None  Radiology No results found.  Procedures Procedures (including critical care time)  Medications Ordered in UC Medications - No data to display  Initial Impression / Assessment and Plan / UC Course  I have reviewed the triage vital  signs and the nursing notes.  Pertinent labs & imaging results that were available during my care of the patient were reviewed by me and considered in my medical decision making (see chart for details).     1. Nausea without vomiting: Zofran as needed  2.  Diabetes mellitus type 2: Patient has not taken any medications in the past few weeks Given the history of polyuria and polydipsia, I will check point-of-care glucose, BMP and CBC.  3.  Complete obesity: Weight loss  advised  4.  Chronic tobacco use: Tobacco cessation advised. Final Clinical Impressions(s) / UC Diagnoses   Final diagnoses:  None   Discharge Instructions   None    ED Prescriptions    None     Controlled Substance Prescriptions [place] Controlled Substance Registry consulted? No   Chase Picket, MD 09/22/18 [phone]

## 2018-09-22 NOTE — ED Triage Notes (Signed)
Pt presents to [facility] for chills and nausea x6 days. Pt also complains of headaches and abdominal pain.

## 2018-09-26 ENCOUNTER — Ambulatory Visit (INDEPENDENT_AMBULATORY_CARE_PROVIDER_SITE_OTHER): Payer: Medicare Other | Admitting: Family Medicine

## 2018-09-26 ENCOUNTER — Encounter: Payer: Self-pay | Admitting: Family Medicine

## 2018-09-26 VITALS — BP 124/64 | HR 88 | Temp 98.1°F | Ht 66.0 in | Wt 267.2 lb

## 2018-09-26 DIAGNOSIS — E1169 Type 2 diabetes mellitus with other specified complication: Secondary | ICD-10-CM | POA: Diagnosis not present

## 2018-09-26 DIAGNOSIS — E039 Hypothyroidism, unspecified: Secondary | ICD-10-CM | POA: Diagnosis not present

## 2018-09-26 DIAGNOSIS — E11649 Type 2 diabetes mellitus with hypoglycemia without coma: Secondary | ICD-10-CM | POA: Diagnosis not present

## 2018-09-26 DIAGNOSIS — E785 Hyperlipidemia, unspecified: Secondary | ICD-10-CM | POA: Diagnosis not present

## 2018-09-26 LAB — POCT GLYCOSYLATED HEMOGLOBIN (HGB A1C): Hemoglobin A1C: 13.8 % — AB (ref 4.0–5.6)

## 2018-09-26 MED ORDER — METFORMIN HCL ER 500 MG PO TB24: 500.0000 mg | ORAL_TABLET | Freq: Every day | ORAL | 1 refills | Status: DC

## 2018-09-26 MED ORDER — INSULIN GLARGINE 100 UNIT/ML SOLOSTAR PEN: 15.0000 [IU] | PEN_INJECTOR | Freq: Every day | SUBCUTANEOUS | 5 refills | Status: DC

## 2018-09-26 MED ORDER — BLOOD GLUCOSE MONITOR KIT: PACK | 0 refills | Status: DC

## 2018-09-26 NOTE — Patient Instructions (Addendum)
Take the levothyroxine in the morning on an empty stomach. Wait 30 minutes before eating. Please confirm that your dose at home (we have in our system that you have the dose)- let me know if we have the dose wrong.   Stop the twice daily metformin and start once daily metformin 500mg  XR. OK to take this in evening.   Continue with glipizide 1/2 tablet daily.   Start lantus 15 units at bedtime.   Please check your sugars in the morning (fasting) and if you feel unwell during day. Write down your sugars when you check them.

## 2018-09-26 NOTE — Progress Notes (Signed)
Sharon Wagner DOB: Feb 07, 1996 Encounter date: 09/26/2018  This isa 23 y.o. female who presents to establish care. Chief Complaint  Patient presents with  . New Patient (Initial Visit)    needs new BG kit    History of present illness: In ED 2/22: WBC elevated, glucose elevated to high 200's, microcytic. Dx of nausea without vomiting. No further documentation of plan noted.   Hasn't been taking medications on a regular basis. Not really sure why but hasn't really felt like she understood medications, what they were for, and importance of taking them.   Has been shaky last few days. Wants to get health back in check.   Was diagnosed with diabetes in 2016. Was shaky, felt like she was having seizure. Went to doc at that time. Sx just kept coming.   Felt like she got shaky with the metformin. Doesn't feel shaky with glipizide in mornings. States that after awhile of taking the metformin she got used to it. Would also get a little headache, dizzy. Hasn't had that issue with metformin in past.   Has been told in past that she had high blood pressure. Not currently on medication for this.   Not been on cholesterol medication in past.   Feels rested after sleep.    Past Medical History:  Diagnosis Date  . Allergy   . Diabetes mellitus without complication [facility])    Past Surgical History:  Procedure Laterality Date  . ADENOIDECTOMY     No Known Allergies Current Meds  Medication Sig  . glipiZIDE (GLUCOTROL) 5 MG tablet Take 0.5 tablets (2.5 mg total) by mouth daily before breakfast.  . levothyroxine (SYNTHROID, LEVOTHROID) 25 MCG tablet Take 1 tablet (25 mcg total) by mouth daily before breakfast.  . [DISCONTINUED] metFORMIN (GLUCOPHAGE) 500 MG tablet Take 1 tablet (500 mg total) by mouth 2 (two) times daily with a meal. ---patient needs appt before any further refills   Social History   Tobacco Use  . Smoking status: Never Smoker  . Smokeless tobacco: Never Used   Substance Use Topics  . Alcohol use: No   Family History  Problem Relation Age of Onset  . Heart disease Paternal Grandfather   . Diabetes Father   . Heart disease Father   . Hypertension Father   . Stroke Father 40       mini stroke  . High Cholesterol Father   . Diabetes Paternal Grandmother   . Arthritis Paternal Grandmother   . Early death Mother        lung issues; heavy smoker. Uncertain exact cause of death  . Early death Maternal Grandmother        smoking related     Review of Systems  Constitutional: Negative for chills, fatigue and fever.  Respiratory: Negative for cough, chest tightness, shortness of breath and wheezing.   Cardiovascular: Negative for chest pain, palpitations and leg swelling.    Objective:  BP 124/64 (BP Location: Left Arm, Patient Position: Sitting, Cuff Size: Normal)   Pulse 88   Temp 98.1 F (36.7 C) (Oral)   Ht 5' 6"  (1.676 m)   Wt 267 lb 3.2 oz (121.2 kg)   LMP 09/22/2018 (Exact Date)   SpO2 98%   BMI 43.13 kg/m   Weight: 267 lb 3.2 oz (121.2 kg)   BP Readings from Last 3 Encounters:  09/26/18 124/64  09/22/18 (!) 151/99  04/07/15 120/88   Wt Readings from Last 3 Encounters:  09/26/18 267 lb 3.2 oz (121.2  kg)  04/07/15 280 lb (127 kg)  11/27/14 275 lb (124.7 kg)    Physical Exam Constitutional:      General: She is not in acute distress.    Appearance: She is well-developed.  HENT:     Head: Normocephalic and atraumatic.  Cardiovascular:     Rate and Rhythm: Normal rate and regular rhythm.     Heart sounds: Normal heart sounds. No murmur.  Pulmonary:     Effort: Pulmonary effort is normal.     Breath sounds: Normal breath sounds.  Abdominal:     General: Bowel sounds are normal. There is no distension.     Palpations: Abdomen is soft.     Tenderness: There is no abdominal tenderness. There is no guarding.  Skin:    General: Skin is warm and dry.     Comments: Sensory exam of the foot is normal, tested with the  monofilament. Good pulses, no lesions or ulcers, good peripheral pulses.  Psychiatric:        Judgment: Judgment normal.     Assessment/Plan: 1. Uncontrolled type 2 diabetes mellitus with hypoglycemia without coma ([place]) Sharon Wagner has some difficulty with reading and reading comprehension and I feel has somewhat fallen to the cracks because of this in the past.  She has not always been clear on directions for taking medications or clear on instructions for how to best manage health.  Typically will have her dad read things for her that she has difficulty with understanding.  Her boyfriend is also a source of help in this way.  We discussed importance of controlling blood sugars and risks of not controlling blood sugars, especially long-term.  She really wants to be in charge of her health and to take control.  I am concerned with elevation of A1c 13 range.  We are going to start evening insulin and continue with the metformin and glipizide that she has been taking.  I did change her metformin to long-acting in the evening since she has complained that she feels somewhat shaky after taking this medication.  I am going to have her check morning sugars and we will check in with her in 2 days time to see how she is doing with the Lantus.  We did demonstrate how to use injection pen and asked her to check again at the pharmacy when she gets this.  I have asked her to call with any concerns regarding how to take medications, cost of medications, or side effects of medications.  She has made significant lifestyle changes in terms of stopping drinking soda, eating healthier food options.  I suspect she continues these along with taking medications that we will be able to get much better control of her blood sugars.  Additionally we discussed the importance of exercise on a daily basis and how this benefits blood sugars.  We talked about decreasing carbohydrates and what she is eating, which she understands as less bread  and less pasta.  We did not go into more detail with this as I think it would be helpful for her to sit down one-on-one with a dietitian which she would like to do to get a better understanding of how to eat healthier.  She does tend to eat a lot of fast food and restaurant food and we will continue to review food choices at upcoming visits.  Additionally she should be on an ACE or ARB for renal protection.  We will consider adding this at a future visit  so as not to overwhelm her with new medications. - blood glucose meter kit and supplies KIT; Dispense based on patient and insurance preference. Use up to four times daily as directed.  Dispense: 1 each; Refill: 0 - POCT glycosylated hemoglobin (Hb A1C) - Insulin Glargine (LANTUS SOLOSTAR) 100 UNIT/ML Solostar Pen; Inject 15 Units into the skin daily.  Dispense: 3 pen; Refill: 5 - Microalbumin / creatinine urine ratio; Future - HM DIABETES FOOT EXAM - Comprehensive metabolic panel; Future - metFORMIN (GLUCOPHAGE-XR) 500 MG 24 hr tablet; Take 1 tablet (500 mg total) by mouth daily.  Dispense: 90 tablet; Refill: 1 - Amb Referral to Nutrition and Diabetic E  2. Hypothyroidism, unspecified type - TSH; Future - T4, free; Future - T3, free  3. Hyperlipidemia associated with type 2 diabetes mellitus ([place]) Should be on a statin for risk factor control and given family history.  I do not want to start too many medications all at 1 visit, so we will consider adding this at a future visit. - Lipid panel; Future  Return in about 2 weeks (around 10/10/2018) for Chronic condition visit.  Micheline Rough, MD

## 2018-09-27 ENCOUNTER — Other Ambulatory Visit: Payer: Self-pay | Admitting: Family Medicine

## 2018-09-27 ENCOUNTER — Telehealth: Payer: Self-pay | Admitting: Family Medicine

## 2018-09-27 DIAGNOSIS — E11649 Type 2 diabetes mellitus with hypoglycemia without coma: Secondary | ICD-10-CM

## 2018-09-27 NOTE — Telephone Encounter (Signed)
Copied from [place] [phone] [id number]. Topic: Quick Communication - See Telephone Encounter >> Sep 27, 2018 12:12 PM Bea Graff, NT wrote: CRM for notification. See Telephone encounter for: 09/27/18. Pt states that the pharmacy stated that the blood glucose meter kit and supplies KIT requires PA. Please advise.

## 2018-09-28 ENCOUNTER — Telehealth: Payer: Self-pay

## 2018-09-28 NOTE — Telephone Encounter (Signed)
Spoke with patients father. He is aware to give 8 units a night.

## 2018-09-28 NOTE — Telephone Encounter (Signed)
Decrease to 8 units nightly through weekend. I think she has changed what she is eating a lot so I don't want her to be low. Then please check in with her again on Monday to get more reads. Thanks!

## 2018-09-28 NOTE — Telephone Encounter (Signed)
Spoke with patient's father.  119 fasting yesterday am 126 after eating yesterday afternoon  86 this am fasting

## 2018-09-28 NOTE — Telephone Encounter (Signed)
-----   Message from Wynn Banker, MD sent at 09/26/2018  3:30 PM EST ----- Check in with her on Friday or Monday to see how morning sugars are looking

## 2018-10-01 NOTE — Telephone Encounter (Signed)
Spoke with patient's pharmacy. The soft click lancets require that a CMN form be filled out. [facility] is supposed to fax this to [place]. I have requested that it be resent.

## 2018-10-09 NOTE — Telephone Encounter (Signed)
Form has been completed and faxed back. Nothing further needed.  

## 2018-10-09 NOTE — Progress Notes (Signed)
Sharon Wagner DOB: 11-25-1995 Encounter date: 10/10/2018  This is a 23 y.o. female who presents with Chief Complaint  Patient presents with  . Follow-up    diabetes    History of present illness: At last visit we discussed uncontrolled diabetes. We started evening lantus and switched metformin to long acting in hopes of decreasing side effect of "shaking with medication". Referral was placed for dietician.  *I made a note previously to discuss statin and Ace addition due to diabetes.   Not having any shaking. Feeling well overall.   Drinking water. Really working towards weight loss. Done worse in last couple of days. More junk food.   Has been doing pretty well checking sugars. Is starting to see trends with what she eats and what sugars look like. Most morning sugars are in the 100-135 range; afternoon sugars are in the 100-150 range (average) with highest at 263 (after pizza and cinnamon rolls). She has not had any hypoglycemia.   She has been walking for 30 minutes; then will take a break and walk again. 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   No Known Allergies Current Meds  Medication Sig  . ACCU-CHEK SOFTCLIX LANCETS lancets Check blood sugars 2 times daily  . blood glucose meter kit and supplies KIT Dispense based on patient and insurance preference. Use up to four times daily as directed.  . Blood Glucose Monitoring Suppl (ACCU-CHEK AVIVA PLUS) W/DEVICE KIT Check blood sugars 2 times daily.  Marland Kitchen glipiZIDE (GLUCOTROL) 5 MG tablet Take 0.5 tablets (2.5 mg total) by mouth daily before breakfast.  . glucose blood (ACCU-CHEK AVIVA) test strip Check blood sugars 2 times daily  . Insulin Glargine (LANTUS SOLOSTAR) 100 UNIT/ML Solostar Pen Inject 10 Units into the skin at bedtime.  Marland Kitchen levothyroxine (SYNTHROID, LEVOTHROID) 25 MCG tablet Take 1 tablet (25 mcg total) by mouth daily before  breakfast.  . metFORMIN (GLUCOPHAGE-XR) 500 MG 24 hr tablet Take 1 tablet (500 mg total) by mouth daily.  . [DISCONTINUED] Insulin Glargine (LANTUS SOLOSTAR) 100 UNIT/ML Solostar Pen Inject 15 Units into the skin daily.    Review of Systems  Constitutional: Negative for chills, fatigue and fever.  Respiratory: Negative for cough, chest tightness, shortness of breath and wheezing.   Cardiovascular: Negative for chest pain, palpitations and leg swelling.    Objective:  BP 130/80 (BP Location: Left Arm, Patient Position: Sitting, Cuff Size: Normal)   Pulse 82   Temp 98.2 F (36.8 C) (Oral)   Wt 257 lb 12.8 oz (116.9 kg)   LMP 09/22/2018 (Exact Date)   SpO2 99%   BMI 41.61 kg/m   Weight: 257 lb 12.8 oz (116.9 kg)   BP Readings from Last 3 Encounters:  10/10/18 130/80  09/26/18 124/64  09/22/18 (!) 151/99   Wt Readings from Last 3 Encounters:  10/10/18 257 lb 12.8 oz (116.9 kg)  09/26/18 267 lb 3.2 oz (121.2 kg)  04/07/15 280 lb (127 kg)    Physical Exam Constitutional:      General: She is not in acute distress.    Appearance: She is well-developed.  Cardiovascular:     Rate and Rhythm: Normal rate and regular rhythm.     Heart sounds: Normal heart sounds. No murmur. No friction rub.  Pulmonary:     Effort: Pulmonary effort is normal. No respiratory distress.     Breath sounds: Normal breath sounds.  No wheezing or rales.  Musculoskeletal:     Right lower leg: No edema.     Left lower leg: No edema.  Neurological:     Mental Status: She is alert and oriented to person, place, and time.  Psychiatric:        Behavior: Behavior normal.     Assessment/Plan 1. Uncontrolled type 2 diabetes mellitus with hypoglycemia without coma ([place]) She has been doing a great job at working on eating healthier and cutting out extra carbohydrates in her diet.  She is also been adding in exercise.  We discussed today that if she continues to work this hard I do think that we will be able  to control her blood sugar with oral medications only, which would be ideal for her.  I would like to see her next A1c before making this change.  I have asked her to report back blood sugars in 2 weeks so that I can see how fasting morning sugars are doing with Lantus 10 mg at bedtime.  I have asked her to decrease her testing to only once or twice daily.  Continue other medications.  We will start low-dose lisinopril for renal protection. Discussed new medication(s) today with patient. Discussed potential side effects and patient verbalized understanding.  - Insulin Glargine (LANTUS SOLOSTAR) 100 UNIT/ML Solostar Pen; Inject 10 Units into the skin at bedtime.  Dispense: 3 pen; Refill: 5 - lisinopril (PRINIVIL,ZESTRIL) 5 MG tablet; Take 0.5 tablets (2.5 mg total) by mouth daily.  Dispense: 45 tablet; Refill: 1 - Comprehensive metabolic panel - Microalbumin / creatinine urine ratio - Insulin Pen Needle (B-D ULTRAFINE III SHORT PEN) 31G X 8 MM MISC; 1 Units by Does not apply route at bedtime.  Dispense: 90 each; Refill: 1  2. Hypothyroidism, unspecified type - T4, free - TSH  3. Hyperlipidemia associated with type 2 diabetes mellitus ([place]) We discussed starting a statin medication even if cholesterol numbers are looking good.  She is willing to do this.  We will await lab results before starting. - Lipid panel     Return pending bloodwork review.    Micheline Rough, MD

## 2018-10-10 ENCOUNTER — Encounter: Payer: Self-pay | Admitting: Family Medicine

## 2018-10-10 ENCOUNTER — Other Ambulatory Visit: Payer: Self-pay

## 2018-10-10 ENCOUNTER — Ambulatory Visit (INDEPENDENT_AMBULATORY_CARE_PROVIDER_SITE_OTHER): Payer: Medicare Other | Admitting: Family Medicine

## 2018-10-10 DIAGNOSIS — E785 Hyperlipidemia, unspecified: Secondary | ICD-10-CM | POA: Diagnosis not present

## 2018-10-10 DIAGNOSIS — E11649 Type 2 diabetes mellitus with hypoglycemia without coma: Secondary | ICD-10-CM

## 2018-10-10 DIAGNOSIS — E039 Hypothyroidism, unspecified: Secondary | ICD-10-CM

## 2018-10-10 DIAGNOSIS — E1169 Type 2 diabetes mellitus with other specified complication: Secondary | ICD-10-CM | POA: Diagnosis not present

## 2018-10-10 LAB — LIPID PANEL
Cholesterol: 139 mg/dL (ref 0–200)
HDL: 32.1 mg/dL — ABNORMAL LOW (ref >39.00)
LDL Cholesterol: 73 mg/dL (ref 0–99)
NonHDL: 106.92
Total CHOL/HDL Ratio: 4
Triglycerides: 168 mg/dL — ABNORMAL HIGH (ref 0.0–149.0)
VLDL: 33.6 mg/dL (ref 0.0–40.0)

## 2018-10-10 LAB — COMPREHENSIVE METABOLIC PANEL
ALT: 25 U/L (ref 0–35)
AST: 26 U/L (ref 0–37)
Albumin: 4 g/dL (ref 3.5–5.2)
Alkaline Phosphatase: 70 U/L (ref 39–117)
BUN: 9 mg/dL (ref 6–23)
CO2: 27 mEq/L (ref 19–32)
Calcium: 9.6 mg/dL (ref 8.4–10.5)
Chloride: 104 mEq/L (ref 96–112)
Creatinine, Ser: 0.65 mg/dL (ref 0.40–1.20)
GFR: 134.84 mL/min (ref >60.00)
GLUCOSE: 78 mg/dL (ref 70–99)
Potassium: 4.2 mEq/L (ref 3.5–5.1)
Sodium: 138 mEq/L (ref 135–145)
TOTAL PROTEIN: 7.4 g/dL (ref 6.0–8.3)
Total Bilirubin: 0.4 mg/dL (ref 0.2–1.2)

## 2018-10-10 LAB — MICROALBUMIN / CREATININE URINE RATIO
Creatinine,U: 117.9 mg/dL
Microalb Creat Ratio: 10.5 mg/g (ref 0.0–30.0)
Microalb, Ur: 12.4 mg/dL — ABNORMAL HIGH (ref 0.0–1.9)

## 2018-10-10 LAB — T4, FREE: FREE T4: 0.97 ng/dL (ref 0.60–1.60)

## 2018-10-10 LAB — TSH: TSH: 3.89 u[IU]/mL (ref 0.35–4.50)

## 2018-10-10 MED ORDER — LISINOPRIL 5 MG PO TABS: 2.5000 mg | ORAL_TABLET | Freq: Every day | ORAL | 1 refills | Status: DC

## 2018-10-10 MED ORDER — INSULIN PEN NEEDLE 31G X 8 MM MISC: 1.0000 [IU] | Freq: Every day | 1 refills | Status: DC

## 2018-10-10 MED ORDER — INSULIN GLARGINE 100 UNIT/ML SOLOSTAR PEN: 10.0000 [IU] | PEN_INJECTOR | Freq: Every day | SUBCUTANEOUS | 5 refills | Status: DC

## 2018-10-10 NOTE — Patient Instructions (Addendum)
Please take your lantus 10 units at bedtime only.   Check blood sugars fasting in the morning and then one other sugar/day.   We are starting lisinopril to protect your kidneys. (let me know if you have a cough with this)  I will send in a statin (cholesterol) once I get your bloodwork results.    **Please report your sugars to me in 2 weeks time**

## 2018-10-11 ENCOUNTER — Telehealth: Payer: Self-pay | Admitting: Family Medicine

## 2018-10-11 NOTE — Telephone Encounter (Signed)
Pt calling office to see how she should take Lisinopril tablet. Pt states she took a whole tablet 5 mg) this morning. Pt's current prescription states for the pt to take a 1/2 tab daily but pt states she was not provided with half tabs from the pharmacy. Pt advised that she could break the tablet in half and continue to take one a day. Pt verbalized understanding.

## 2018-10-15 ENCOUNTER — Encounter: Payer: Self-pay | Admitting: Family Medicine

## 2018-10-15 ENCOUNTER — Other Ambulatory Visit: Payer: Self-pay | Admitting: Family Medicine

## 2018-10-15 ENCOUNTER — Ambulatory Visit: Payer: Medicare Other | Admitting: Registered"

## 2018-10-15 DIAGNOSIS — E11649 Type 2 diabetes mellitus with hypoglycemia without coma: Secondary | ICD-10-CM

## 2018-10-16 ENCOUNTER — Encounter: Payer: Medicare Other | Attending: Family Medicine | Admitting: *Deleted

## 2018-10-16 ENCOUNTER — Other Ambulatory Visit: Payer: Self-pay

## 2018-10-16 DIAGNOSIS — E119 Type 2 diabetes mellitus without complications: Secondary | ICD-10-CM | POA: Diagnosis not present

## 2018-10-16 NOTE — Patient Instructions (Addendum)
-  Check your blood sugar in the morning, goal: 80-130                                          2h after meal, goal: less than 180   -Ask your doctor for refills for blood glucose strips and lancets   -At breakfast, eat half the biscuit.   -Eat a vegetable at one to two meals per day.   -Continue drinking water! :)

## 2018-10-16 NOTE — Progress Notes (Signed)
Diabetes Self-Management Education  Visit Type: First/Initial  Appt. Start Time: 2:20 Appt. End Time: 3:15  10/16/2018  [name], identified by name and date of birth, is a 23 y.o. female with a diagnosis of Diabetes: Type 2.   ASSESSMENT Pt expectations: Would like information on what to eat   Pt arrives with boyfriend. Pt's boyfriend and dad are a support system for her and help her in the management of her diabetes. 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 Pt goes to bed at midnight and wakes up around 6AM. Stops for fast food daily on the way to work. Due to limited literacy, explained information in simple terms. Pt appears motivated to control diabetes and continue to make lifestyle changes.   Last menstrual period 09/22/2018. There is no height or weight on file to calculate BMI.  Diabetes Self-Management Education - 10/16/18 1426      Initial Visit   Are you taking your medications as prescribed?  Yes    Date Diagnosed  2013      Psychosocial Assessment   Other persons present  Patient;Spouse/SO    Patient Concerns  Nutrition/Meal planning    How often do you need to have someone help you when you read instructions, pamphlets, or other written materials from your doctor or pharmacy?  3 - Sometimes      Pre-Education Assessment   Patient understands the diabetes disease and treatment process.  Needs Instruction    Patient understands incorporating nutritional management into lifestyle.  Needs Instruction    Patient undertands incorporating physical activity into lifestyle.  Needs Instruction    Patient understands using medications safely.  Needs Instruction    Patient understands monitoring blood glucose, interpreting and using results  Needs Instruction    Patient understands prevention, detection, and treatment of acute complications.  Needs Instruction    Patient understands prevention, detection, and  treatment of chronic complications.  Needs Instruction    Patient understands how to develop strategies to address psychosocial issues.  Needs Instruction    Patient understands how to develop strategies to promote health/change behavior.  Needs Instruction      Complications   Fasting Blood glucose range (mg/dL)  97-588   325   Postprandial Blood glucose range (mg/dL)  49-826   415   Number of hypoglycemic episodes per month  3    Number of hyperglycemic episodes per week  1   222   Can you tell when your blood sugar is high?  Yes      Dietary Intake   Breakfast  Fast food daily: bacon egg and cheese with hashbrowns OR sausage biscuit OR chicken biscuit     Lunch  fast food grilled chicken sandwich on whole wheat bread with 3 hushpuppies; pork chop in shake and bake     Dinner  Skips 3 nights per week OR [place] sandwich on [employer] bread with granola bar     Snack (evening)  baked chips     Beverage(s)  water       Exercise   Exercise Type  Light (walking / raking leaves)    How many days per week to you exercise?  2    How many minutes per day do you exercise?  60    Total minutes per week of exercise  120      Patient Education   Previous Diabetes Education  No    Disease state   Definition  of diabetes, type 1 and 2, and the diagnosis of diabetes    Nutrition management   Role of diet in the treatment of diabetes and the relationship between the three main macronutrients and blood glucose level;Reviewed blood glucose goals for pre and post meals and how to evaluate the patients' food intake on their blood glucose level.;Information on hints to eating out and maintain blood glucose control.    Physical activity and exercise   Role of exercise on diabetes management, blood pressure control and cardiac health.;Helped patient identify appropriate exercises in relation to his/her diabetes, diabetes complications and other health issue.    Medications  Reviewed patients medication for  diabetes, action, purpose, timing of dose and side effects.    Monitoring  Identified appropriate SMBG and/or A1C goals.    Acute complications  Taught treatment of hypoglycemia - the 15 rule.    Psychosocial adjustment  Identified and addressed patients feelings and concerns about diabetes;Helped patient identify a support system for diabetes management      Individualized Goals (developed by patient)   Nutrition  General guidelines for healthy choices and portions discussed    Physical Activity  Exercise 1-2 times per week    Medications  take my medication as prescribed    Monitoring   test my blood glucose as discussed;test blood glucose pre and post meals as discussed      Post-Education Assessment   Patient understands the diabetes disease and treatment process.  Demonstrates understanding / competency    Patient understands incorporating nutritional management into lifestyle.  Demonstrates understanding / competency    Patient undertands incorporating physical activity into lifestyle.  Demonstrates understanding / competency    Patient understands using medications safely.  Demonstrates understanding / competency    Patient understands monitoring blood glucose, interpreting and using results  Needs Review    Patient understands prevention, detection, and treatment of acute complications.  Demonstrates understanding / competency    Patient understands prevention, detection, and treatment of chronic complications.  Demonstrates understanding / competency    Patient understands how to develop strategies to address psychosocial issues.  Needs Review    Patient understands how to develop strategies to promote health/change behavior.  Demonstrates understanding / competency      Outcomes   Expected Outcomes  Demonstrated interest in learning. Expect positive outcomes    Future DMSE  3-4 months    Program Status  Not Completed       Individualized Plan for Diabetes Self-Management  Training:   Learning Objective:  Patient will have a greater understanding of diabetes self-management. Patient education plan is to attend individual and/or group sessions per assessed needs and concerns.   Plan:   Patient Instructions  -Check your blood sugar in the morning, goal: 80-130                                          2h after meal, goal: less than 180   -Ask your doctor for refills for blood glucose strips and lancets   -At breakfast, eat half the biscuit.   -Eat a vegetable at one to two meals per day.   -Continue drinking water! :)    Expected Outcomes:  Demonstrated interest in learning. Expect positive outcomes  Education material provided: ADA Diabetes: Your Take Control Guide and My Plate  If problems or questions, patient to contact team via:  Phone and Email  Future DSME appointment: 3-4 months

## 2018-10-20 ENCOUNTER — Other Ambulatory Visit: Payer: Self-pay | Admitting: Family Medicine

## 2018-10-23 MED ORDER — ACCU-CHEK FASTCLIX LANCETS MISC: 0 refills | Status: DC

## 2018-10-24 ENCOUNTER — Other Ambulatory Visit (HOSPITAL_COMMUNITY)
Admission: RE | Admit: 2018-10-24 | Discharge: 2018-10-24 | Disposition: A | Discharge status: Home / Self Care | Payer: Medicare Other | Source: Ambulatory Visit | Attending: Family Medicine | Admitting: Family Medicine

## 2018-10-24 ENCOUNTER — Other Ambulatory Visit: Payer: Self-pay

## 2018-10-24 ENCOUNTER — Ambulatory Visit: Payer: Self-pay | Admitting: Family Medicine

## 2018-10-24 ENCOUNTER — Ambulatory Visit (INDEPENDENT_AMBULATORY_CARE_PROVIDER_SITE_OTHER): Payer: Medicare Other | Admitting: Family Medicine

## 2018-10-24 ENCOUNTER — Encounter: Payer: Self-pay | Admitting: Family Medicine

## 2018-10-24 VITALS — BP 122/64 | HR 89 | Temp 98.2°F | Wt 249.1 lb

## 2018-10-24 DIAGNOSIS — N939 Abnormal uterine and vaginal bleeding, unspecified: Secondary | ICD-10-CM | POA: Insufficient documentation

## 2018-10-24 DIAGNOSIS — N921 Excessive and frequent menstruation with irregular cycle: Secondary | ICD-10-CM | POA: Diagnosis not present

## 2018-10-24 DIAGNOSIS — E11649 Type 2 diabetes mellitus with hypoglycemia without coma: Secondary | ICD-10-CM

## 2018-10-24 DIAGNOSIS — Z124 Encounter for screening for malignant neoplasm of cervix: Secondary | ICD-10-CM

## 2018-10-24 LAB — POCT URINE PREGNANCY: Preg Test, Ur: NEGATIVE

## 2018-10-24 MED ORDER — ACCU-CHEK FASTCLIX LANCETS MISC: 1 refills | Status: AC

## 2018-10-24 MED ORDER — INSULIN GLARGINE 100 UNIT/ML SOLOSTAR PEN: 5.0000 [IU] | PEN_INJECTOR | Freq: Every day | SUBCUTANEOUS | 3 refills | Status: DC

## 2018-10-24 MED ORDER — NORETHIN ACE-ETH ESTRAD-FE 1-20 MG-MCG PO TABS: 1.0000 | ORAL_TABLET | Freq: Every day | ORAL | 11 refills | Status: DC

## 2018-10-24 MED ORDER — INSULIN PEN NEEDLE 31G X 8 MM MISC: 1.0000 [IU] | Freq: Every day | 1 refills | Status: DC

## 2018-10-24 NOTE — Progress Notes (Signed)
Sharon Wagner DOB: [birth year] Encounter date: 10/24/2018  This is a 23 y.o. female who presents with Chief Complaint  Patient presents with  . Vaginal Bleeding    strated monday, cramps,     History of present illness: Finished period last Friday. Then noted bleeding on Monday night and wondered what was going on. Was a lot, but this morning less. Also had some cramping; notes it more at night.   Hasn't had pap smear in awhile. Not having any discomfort with urination; but feels like there is "scratches" down below.   Periods are usually monthly; doesn't usually have bleeding between periods.   Using condoms for pregnancy prevention. Had a lot of weight gain with birth control in the past which is why she stopped this.   No fevers.   Doing ok with diabetic medications. Meeting with dietician went well. She feels like she has a lot more information and ideas for healthy eating. She has continued with her regular exercise and has continued with cutting out some of the less healthy options.      No Known Allergies Current Meds  Medication Sig  . Accu-Chek FastClix Lancets MISC USE UP TO FOUR TIMES A DAY AS DIRECTED  . blood glucose meter kit and supplies KIT Dispense based on patient and insurance preference. Use up to four times daily as directed.  . Blood Glucose Monitoring Suppl (ACCU-CHEK AVIVA PLUS) W/DEVICE KIT Check blood sugars 2 times daily.  Marland Kitchen glipiZIDE (GLUCOTROL) 5 MG tablet Take 0.5 tablets (2.5 mg total) by mouth daily before breakfast.  . glucose blood (ACCU-CHEK AVIVA) test strip Check blood sugars 2 times daily  . Insulin Glargine (LANTUS SOLOSTAR) 100 UNIT/ML Solostar Pen Inject 5-10 Units into the skin at bedtime.  . Insulin Pen Needle (B-D ULTRAFINE III SHORT PEN) 31G X 8 MM MISC 1 Units by Does not apply route at bedtime.  Marland Kitchen levothyroxine (SYNTHROID, LEVOTHROID) 25 MCG tablet Take 1 tablet (25 mcg total) by mouth daily before breakfast.  .  lisinopril (PRINIVIL,ZESTRIL) 5 MG tablet Take 0.5 tablets (2.5 mg total) by mouth daily.  . metFORMIN (GLUCOPHAGE-XR) 500 MG 24 hr tablet Take 1 tablet (500 mg total) by mouth daily.  . [DISCONTINUED] Accu-Chek FastClix Lancets MISC USE UP TO FOUR TIMES A DAY AS DIRECTED  . [DISCONTINUED] Insulin Glargine (LANTUS SOLOSTAR) 100 UNIT/ML Solostar Pen Inject 10 Units into the skin at bedtime.  . [DISCONTINUED] Insulin Pen Needle (B-D ULTRAFINE III SHORT PEN) 31G X 8 MM MISC 1 Units by Does not apply route at bedtime.    Review of Systems  Constitutional: Negative for chills, fatigue and fever.  Gastrointestinal: Negative for constipation and diarrhea. Abdominal pain: cramping.  Genitourinary: Positive for menstrual problem. Negative for difficulty urinating, dyspareunia, flank pain, genital sores and pelvic pain.  Neurological: Negative for dizziness.    Objective:  BP 122/64 (BP Location: Left Arm, Patient Position: Sitting, Cuff Size: Normal)   Pulse 89   Temp 98.2 F (36.8 C) (Oral)   Wt 249 lb 1.6 oz (113 kg)   LMP 10/19/2018   SpO2 97%   BMI 40.21 kg/m   Weight: 249 lb 1.6 oz (113 kg)   BP Readings from Last 3 Encounters:  10/24/18 122/64  10/10/18 130/80  09/26/18 124/64   Wt Readings from Last 3 Encounters:  10/24/18 249 lb 1.6 oz (113 kg)  10/10/18 257 lb 12.8 oz (116.9 kg)  09/26/18 267 lb 3.2 oz (121.2 kg)    Physical Exam  Exam conducted with a chaperone present.  Constitutional:      General: She is not in acute distress.    Appearance: She is well-developed.  Cardiovascular:     Rate and Rhythm: Normal rate and regular rhythm.     Heart sounds: Normal heart sounds. No murmur. No friction rub.  Pulmonary:     Effort: Pulmonary effort is normal. No respiratory distress.     Breath sounds: Normal breath sounds. No wheezing or rales.  Abdominal:     General: Abdomen is flat. Bowel sounds are normal. There is no distension.     Tenderness: There is no abdominal  tenderness. There is no guarding or rebound.  Genitourinary:    General: Normal vulva.     Exam position: Supine.     Pubic Area: No rash.      Labia:        Right: No rash, tenderness, lesion or injury.        Left: No rash, tenderness, lesion or injury.      Urethra: No prolapse or urethral swelling.     Vagina: Normal.     Cervix: Cervical bleeding present. No cervical motion tenderness, discharge, friability, lesion, erythema or eversion (mild).     Uterus: Not enlarged and not tender.      Adnexa: Right adnexa normal and left adnexa normal.     Comments: ectropion Musculoskeletal:     Right lower leg: No edema.     Left lower leg: No edema.  Neurological:     Mental Status: She is alert and oriented to person, place, and time.  Psychiatric:        Behavior: Behavior normal.     Assessment/Plan 1. Vaginal bleeding Bleeding is coming from cervix. She was interested in ocp anyway, so we will start this in hopes of duel function of pregnancy prevention as well as cycle control. - POCT urine pregnancy - Cytology - PAP  2. Screening for cervical cancer Completed during exam today - Cytology - PAP  3. Uncontrolled type 2 diabetes mellitus with hypoglycemia without coma ([place]) She is doing great with working on healthy lifestyle changes. Her sugars have been decreasing (last 3 morning fastings in high 70's to 80's). She has cut out unhealthy foods and continued to lose weight.  - Insulin Glargine (LANTUS SOLOSTAR) 100 UNIT/ML Solostar Pen; Inject 5-10 Units into the skin at bedtime.  Dispense: 3 pen; Refill: 3 - Insulin Pen Needle (B-D ULTRAFINE III SHORT PEN) 31G X 8 MM MISC; 1 Units by Does not apply route at bedtime.  Dispense: 90 each; Refill: 1 - Accu-Chek FastClix Lancets MISC; USE UP TO FOUR TIMES A DAY AS DIRECTED  Dispense: 100 each; Refill: 1  4. Menometrorrhagia Will start ocp after discussion of options available. Still recommend using condom for pregnancy  prevention/STD prevention. She will update me if period worsens or has not resolved in 1 weeks time.   Return web based visit in 1 month to review sugars.      Micheline Rough, MD

## 2018-10-24 NOTE — Telephone Encounter (Signed)
I have long nails.   Sometimes I scratch myself when I'm washing with my nails down there.    I don't know if it's my period or not.  Last Friday was the last day of her last period.  I've had this happen before.  It's bleeding like my period.   My periods are regular.   I don't feel sore down there.  I'm having cramping like I'm having my period.    See the triage notes.  I made her an appt with Dr. Hassan Rowan for today at 3:00.  I sent these notes to the office.      Reason for Disposition . [1] Menstrual cycle < 21 days OR > 35 days AND [2] occurs more than two cycles (2 months) this past year  Answer Assessment - Initial Assessment Questions 1. AMOUNT: "Describe the bleeding that you are having."    - SPOTTING: spotting, or pinkish / brownish mucous discharge; does not fill panti-liner or pad    - MILD:  less than 1 pad / hour; less than patient's usual menstrual bleeding   - MODERATE: 1-2 pads / hour; 1 menstrual cup every 6 hours; small-medium blood clots (e.g., pea, grape, small coin)   - SEVERE: soaking 2 or more pads/hour for 2 or more hours; 1 menstrual cup every 2 hours; bleeding not contained by pads or continuous red blood from vagina; large blood clots (e.g., golf ball, large coin)      I'm using pads.  Go through 1 pad last night.   Only a little bleeding this morning when I woke up.    Every time I use the toilet I see blood on the tissue. 2. ONSET: "When did the bleeding begin?" "Is it continuing now?"     Monday.   I went to urgent care to get it checked out and they told me to call y'all. 3. MENSTRUAL PERIOD: "When was the last normal menstrual period?" "How is this different than your period?"     See above 4. REGULARITY: "How regular are your periods?"     Very regular. 5. ABDOMINAL PAIN: "Do you have any pain?" "How bad is the pain?"  (e.g., Scale 1-10; mild, moderate, or severe)   - MILD (1-3): doesn't interfere with normal activities, abdomen soft and not tender  to touch    - MODERATE (4-7): interferes with normal activities or awakens from sleep, tender to touch    - SEVERE (8-10): excruciating pain, doubled over, unable to do any normal activities      During the night it cramped but it's not cramping now.   6. PREGNANCY: "Could you be pregnant?" "Are you sexually active?" "Did you recently give birth?"     Possible.  Yes sexually active 7. BREASTFEEDING: "Are you breastfeeding?"     N/A 8. HORMONES: "Are you taking any hormone medications, prescription or OTC?" (e.g., birth control pills, estrogen)     No 9. BLOOD THINNERS: "Do you take any blood thinners?" (e.g., Coumadin/warfarin, Pradaxa/dabigatran, aspirin)     No 10. CAUSE: "What do you think is causing the bleeding?" (e.g., recent gyn surgery, recent gyn procedure; known bleeding disorder, cervical cancer, polycystic ovarian disease, fibroids)         I don't know if I scratched myself or it's myp  period 11. HEMODYNAMIC STATUS: "Are you weak or feeling lightheaded?" If so, ask: "Can you stand and walk normally?"        No 12. OTHER SYMPTOMS: "What other  symptoms are you having with the bleeding?" (e.g., passed tissue, vaginal discharge, fever, menstrual-type cramps)       No fever.  Protocols used: VAGINAL BLEEDING - ABNORMAL-A-AH

## 2018-10-24 NOTE — Patient Instructions (Signed)
Decrease lantus to 5 units nightly. If sugars are over 120 in the morning, then try 7 units nightly.

## 2018-10-26 LAB — CYTOLOGY - PAP
CHLAMYDIA, DNA PROBE: NEGATIVE
Diagnosis: NEGATIVE
Neisseria Gonorrhea: NEGATIVE
Trichomonas: NEGATIVE

## 2018-11-02 ENCOUNTER — Other Ambulatory Visit: Payer: Self-pay | Admitting: *Deleted

## 2018-11-02 ENCOUNTER — Telehealth: Payer: Self-pay | Admitting: *Deleted

## 2018-11-02 ENCOUNTER — Other Ambulatory Visit: Payer: Self-pay

## 2018-11-02 DIAGNOSIS — E119 Type 2 diabetes mellitus without complications: Secondary | ICD-10-CM

## 2018-11-02 MED ORDER — GLUCOSE BLOOD VI STRP: ORAL_STRIP | 12 refills | Status: DC

## 2018-11-02 NOTE — Telephone Encounter (Signed)
Rx has been sent in. 

## 2018-11-02 NOTE — Telephone Encounter (Signed)
Patient called after hours line requesting a refill on for her test strips to check her sugar

## 2018-11-05 ENCOUNTER — Telehealth: Payer: Self-pay

## 2018-11-05 DIAGNOSIS — E119 Type 2 diabetes mellitus without complications: Secondary | ICD-10-CM

## 2018-11-05 MED ORDER — GLUCOSE BLOOD VI STRP: ORAL_STRIP | 12 refills | Status: DC

## 2018-11-05 NOTE — Telephone Encounter (Signed)
Copied from CRM [id number]. Topic: General - Other >> Nov 05, 2018  1:30 PM Laural Benes, [place] C wrote: Reason for CRM: pt would like to have her Rx for glucose blood (ACCU-CHEK AVIVA) test strip sent to the [facility] on [address] instead.

## 2018-11-05 NOTE — Telephone Encounter (Signed)
Rx has been resent 

## 2018-11-14 NOTE — Telephone Encounter (Signed)
Sharon Wagner faxed form back to practice to have Dr. Hassan Rowan to complete missing information in order for patient to get test strips.  [phone]

## 2018-11-14 NOTE — Telephone Encounter (Signed)
Noted.  Will keep an eye out for the form

## 2018-11-19 ENCOUNTER — Telehealth: Payer: Self-pay | Admitting: *Deleted

## 2018-11-19 NOTE — Telephone Encounter (Signed)
Sharon Wagner called from [facility] [phone].  States she received the [facility] form for testing supplies and this was incomplete as the following was missing:  #8, signature of the prescriber and date.  Message sent to [place].

## 2018-11-19 NOTE — Telephone Encounter (Signed)
See phone encounter from 11/19/2018.

## 2018-11-20 NOTE — Telephone Encounter (Signed)
Spoke with pharmacy, they are faxing the forms back to be completed. Will keep an eye out for forms.

## 2018-11-21 ENCOUNTER — Encounter: Payer: Self-pay | Admitting: Family Medicine

## 2018-11-21 ENCOUNTER — Ambulatory Visit: Payer: Self-pay

## 2018-11-21 NOTE — Telephone Encounter (Signed)
Pt. Called to ask about the correct dose of Lantus Insulin at bedtime.  Reported she had episode last night of feeling jittery about 12:30 AM.  In questioning pt., she had not eaten anything since 4:00 PM; at hamburger, macaroni and cheese, and corn and peas.  Her blood sugar at 11:30 PM was 181.  Reported taking 10 units of Lantus Insulin at 11:30, prior to feeling jittery at 12:30 AM.  Reported she did not understand if she was to take 7 units or 10 units at bedtime. Reviewed with patient the recommendations by Dr. Hassan Rowan, to "take Lantus Ins. 5 units at hs, if FBS is < 120, and Lantus Ins. 7 units at hs, if FBS is >120."   Questioned about any symptoms she is having; c/o increased urination, dry mouth, and intermittent feeling jittery.  Spent about 45 minutes discussing DM, importance of eating on regular schedule, staying hydrated with water, fluctuation in  blood sugars, and correct insulin dose, as recommended per PCP.  Care advice given per protocol. All questions answered.  Pt. Verb. Understanding.  Encouraged to call back with further questions or consistently higher blood sugar readings.  Agreed with plan.     Reason for Disposition . Blood glucose 70-240 mg/dL (3.9 -30.8 mmol/L)  Answer Assessment - Initial Assessment Questions 1. BLOOD GLUCOSE: "What is your blood glucose level?"      181 at 11:30 PM 4/21;  119 at 9:54 AM 4/22;  122 @ 3:20 PM 2. ONSET: "When did you check the blood glucose?"     See above 3. USUAL RANGE: "What is your glucose level usually?" (e.g., usual fasting morning value, usual evening value)     *No Answer* 4. KETONES: "Do you check for ketones (urine or blood test strips)?" If yes, ask: "What does the test show now?"      *No Answer* 5. TYPE 1 or 2:  "Do you know what type of diabetes you have?"  (e.g., Type 1, Type 2, Gestational; doesn't know)      Type 2 diabetes 6. INSULIN: "Do you take insulin?" "What type of insulin(s) do you use? What is the mode of  delivery? (syringe, pen (e.g., injection or  pump)?"      Lantus 7. DIABETES PILLS: "Do you take any pills for your diabetes?" If yes, ask: "Have you missed taking any pills recently?"     *No Answer* 8. OTHER SYMPTOMS: "Do you have any symptoms?" (e.g., fever, frequent urination, difficulty breathing, dizziness, weakness, vomiting)     Freq. urination, dry mouth/ thirsty, intermittent shakiness,   9. PREGNANCY: "Is there any chance you are pregnant?" "When was your last menstrual period?"     LMP; started cycle today  Protocols used: DIABETES - HIGH BLOOD SUGAR-A-AH

## 2018-11-21 NOTE — Telephone Encounter (Signed)
Message from Sharon Wagner sent at 11/21/2018 2:48 PM EDT   Patient said her sugar was 181 last night. She wants to know should she be taking 7 or 10 units of insulin? She said she is feeling shaky. She does not know what her sugar is today, she has not checked

## 2018-11-23 NOTE — Telephone Encounter (Signed)
Please check in with her today and make sure doing ok. Sounds like they addressed questions. Schedule f/u if needed.

## 2018-11-23 NOTE — Telephone Encounter (Signed)
Spoke with patient she stated she is feeling fine. No visit needed.

## 2018-11-27 ENCOUNTER — Encounter: Payer: Self-pay | Admitting: Family Medicine

## 2018-11-28 NOTE — Telephone Encounter (Signed)
Could you call her and get more information? Please see previous mychart message and my response to her.   I'm happy to have a discussion by web or phone as well. If she is consistently high then we need to make adjustment.

## 2018-11-28 NOTE — Telephone Encounter (Signed)
I left a message for the pt to return my call. 

## 2018-12-05 NOTE — Telephone Encounter (Signed)
Forms have been completed and faxed back. Nothing further needed.

## 2018-12-13 ENCOUNTER — Telehealth: Payer: Medicare Other

## 2018-12-13 ENCOUNTER — Ambulatory Visit
Admission: RE | Admit: 2018-12-13 | Discharge: 2018-12-13 | Disposition: A | Discharge status: Home / Self Care | Payer: Medicare Other | Source: Ambulatory Visit

## 2018-12-13 ENCOUNTER — Other Ambulatory Visit: Payer: Self-pay | Admitting: Family Medicine

## 2018-12-13 DIAGNOSIS — E11649 Type 2 diabetes mellitus with hypoglycemia without coma: Secondary | ICD-10-CM

## 2018-12-17 ENCOUNTER — Encounter: Payer: Self-pay | Admitting: Family Medicine

## 2018-12-17 ENCOUNTER — Ambulatory Visit (INDEPENDENT_AMBULATORY_CARE_PROVIDER_SITE_OTHER): Payer: Medicare Other | Admitting: Family Medicine

## 2018-12-17 ENCOUNTER — Other Ambulatory Visit: Payer: Self-pay

## 2018-12-17 VITALS — Wt 241.0 lb

## 2018-12-17 DIAGNOSIS — E11649 Type 2 diabetes mellitus with hypoglycemia without coma: Secondary | ICD-10-CM

## 2018-12-17 DIAGNOSIS — R634 Abnormal weight loss: Secondary | ICD-10-CM | POA: Diagnosis not present

## 2018-12-17 DIAGNOSIS — E039 Hypothyroidism, unspecified: Secondary | ICD-10-CM

## 2018-12-17 DIAGNOSIS — N926 Irregular menstruation, unspecified: Secondary | ICD-10-CM | POA: Diagnosis not present

## 2018-12-17 MED ORDER — GLIPIZIDE 5 MG PO TABS: 2.5000 mg | ORAL_TABLET | Freq: Every day | ORAL | 5 refills | Status: DC

## 2018-12-17 MED ORDER — LISINOPRIL 5 MG PO TABS: 2.5000 mg | ORAL_TABLET | Freq: Every day | ORAL | 5 refills | Status: DC

## 2018-12-17 MED ORDER — METFORMIN HCL ER 500 MG PO TB24: 500.0000 mg | ORAL_TABLET | Freq: Every day | ORAL | 5 refills | Status: DC

## 2018-12-17 MED ORDER — GLUCOSE BLOOD VI STRP: ORAL_STRIP | 12 refills | Status: DC

## 2018-12-17 MED ORDER — LEVOTHYROXINE SODIUM 25 MCG PO TABS: 25.0000 ug | ORAL_TABLET | Freq: Every day | ORAL | 5 refills | Status: DC

## 2018-12-17 NOTE — Progress Notes (Signed)
Virtual Visit via Video Note  I connected with Sharon Wagner  on 12/17/18 at 12:30 PM EDT by a video enabled telemedicine application and verified that I am speaking with the correct person using two identifiers.  Location patient: home Location provider:work or home office Persons participating in the virtual visit: patient, provider  I discussed the limitations of evaluation and management by telemedicine and the availability of in person appointments. The patient expressed understanding and agreed to proceed.   Sharon Wagner DOB: Dec 13, 1995 Encounter date: 12/17/2018  This is a 23 y.o. female who presents with No chief complaint on file.   History of present illness:  HPI  She was calling because she had called pharmacy to get refill on medication and pharmacy told her she needed to contact us.   She also states that she has been losing a lot of weight. She is interested in having surgery to remove some of her excess skin.  She is feeling very well over all. Energy level is doing very well.  She continues to eat healthy and continues to work on getting regular exercise.  Periods had been doing well, but last week started with more bleeding (started with 9 days left of pill pack). Has been bleeding for 4 days. Periods are lighter on the pill. Usually bleeds for a week.   Sugars in morning usually in low 100's. Checking twice daily. Second sugar check is at night - usually 150-160. lantus 5-7 units at bedtime. If sugars gets down to 80's she feels shaky.   No Known Allergies No outpatient medications have been marked as taking for the 12/17/18 encounter (Office Visit) with Wynn Banker, MD.    Review of Systems  Constitutional: Negative for chills, fatigue and fever.  Respiratory: Negative for cough, chest tightness, shortness of breath and wheezing.   Cardiovascular: Negative for chest pain, palpitations and leg swelling.    Objective:  Wt 241 lb  (109.3 kg)   BMI 38.90 kg/m   Weight: 241 lb (109.3 kg)   BP Readings from Last 3 Encounters:  10/24/18 122/64  10/10/18 130/80  09/26/18 124/64   Wt Readings from Last 3 Encounters:  12/17/18 241 lb (109.3 kg)  10/24/18 249 lb 1.6 oz (113 kg)  10/10/18 257 lb 12.8 oz (116.9 kg)    EXAM:  GENERAL: alert, oriented, appears well and in no acute distress  HEENT: atraumatic, conjunctiva clear, no obvious abnormalities on inspection of external nose and ears  NECK: normal movements of the head and neck  LUNGS: on inspection no signs of respiratory distress, breathing rate appears normal, no obvious gross SOB, gasping or wheezing  CV: no obvious cyanosis  MS: moves all visible extremities without noticeable abnormality  PSYCH/NEURO: pleasant and cooperative, no obvious depression or anxiety, speech and thought processing grossly intact  Skin: No obvious visual abnormality.  Assessment/Plan  1. Uncontrolled type 2 diabetes mellitus with hypoglycemia without coma ([facility]) Done a tremendous job at getting better control of her blood sugars.  She is completely change the way she is eating and increased her physical activity.  Because of this she has been successful at losing weight and continues to lose weight in a healthy fashion.  For now we will continue the Lantus 5 units (to 7 units) at bedtime as well as her oral medications.  Can certainly consider cutting back on some of these pending her next A1c which will be due in the next few weeks. - glipiZIDE (GLUCOTROL) 5  MG tablet; Take 0.5 tablets (2.5 mg total) by mouth daily before breakfast.  Dispense: 15 tablet; Refill: 5 - glucose blood (ACCU-CHEK AVIVA) test strip; Check blood sugars 2 times daily  Dispense: 100 each; Refill: 12 - lisinopril (ZESTRIL) 5 MG tablet; Take 0.5 tablets (2.5 mg total) by mouth daily.  Dispense: 15 tablet; Refill: 5 - metFORMIN (GLUCOPHAGE-XR) 500 MG 24 hr tablet; Take 1 tablet (500 mg total) by mouth  daily.  Dispense: 30 tablet; Refill: 5 - Hemoglobin A1c; Future  2. Hypothyroidism, unspecified type Has tolerated medication well.  Will check for stability every 6 months. - levothyroxine (SYNTHROID) 25 MCG tablet; Take 1 tablet (25 mcg total) by mouth daily before breakfast.  Dispense: 30 tablet; Refill: 5  3. Irregular menstrual cycle Periods of improved on hormonal contraception.  We discussed that it can take multiple months to regulate the cycle and in addition she continues to lose weight and make healthy changes to her lifestyle this can also affect her periods.  4. Weight loss She has lost a significant amount of weight.  We discussed that because she is still losing rapidly, do not think a plastic surgery referral is warranted at this time, but we can certainly keep on the back burner for once weight loss starts to stabilize.   Return pending A1C.   I discussed the assessment and treatment plan with the patient. The patient was provided an opportunity to ask questions and all were answered. The patient agreed with the plan and demonstrated an understanding of the instructions.   The patient was advised to call back or seek an in-person evaluation if the symptoms worsen or if the condition fails to improve as anticipated.  I provided  20 minutes of non-face-to-face time during this encounter.   Theodis ShoveJunell Koberlein, MD

## 2018-12-17 NOTE — Telephone Encounter (Signed)
Addressed via phone call and we were able to connect with doxy.

## 2018-12-19 ENCOUNTER — Telehealth: Payer: Self-pay | Admitting: *Deleted

## 2018-12-19 NOTE — Telephone Encounter (Signed)
I called the pt and scheduled both appts as below. 

## 2018-12-19 NOTE — Telephone Encounter (Signed)
-----   Message from Wynn Banker, MD sent at 12/17/2018  2:21 PM EDT ----- Please schedule her to have A1C completed end of may/beginning of June with doxy to follow about a week later for CCV.

## 2018-12-19 NOTE — Telephone Encounter (Signed)
Patient stated the pharmacy told her the Accu-chek Aviva test strips will cost $100 and she asked that a different type be called in.  I called [facility] and per Hepal the pt picked up 2 boxes of 100 on 5/1, paid $6.60 and which she should not be out at this point. She stated she is not sure why the pt would not have any as she is checking this twice a day, unless she lost them or misplaced them and if so she would need to contact the insurance and she stated test strips are expensive.  I called the pt and informed her of this and she stated she lost the strips when she went out of town.  I advised that she call the insurance company to let them know in this case as this was recommended by the pharmacist and she agreed.

## 2018-12-21 ENCOUNTER — Other Ambulatory Visit (INDEPENDENT_AMBULATORY_CARE_PROVIDER_SITE_OTHER): Payer: Self-pay

## 2018-12-21 ENCOUNTER — Other Ambulatory Visit: Payer: Self-pay

## 2018-12-21 DIAGNOSIS — E11649 Type 2 diabetes mellitus with hypoglycemia without coma: Secondary | ICD-10-CM

## 2018-12-21 LAB — HEMOGLOBIN A1C: Hgb A1c MFr Bld: 7.7 % — ABNORMAL HIGH (ref 4.6–6.5)

## 2018-12-28 ENCOUNTER — Other Ambulatory Visit: Payer: Self-pay

## 2018-12-28 ENCOUNTER — Ambulatory Visit (INDEPENDENT_AMBULATORY_CARE_PROVIDER_SITE_OTHER): Payer: Medicare Other | Admitting: Family Medicine

## 2018-12-28 DIAGNOSIS — E785 Hyperlipidemia, unspecified: Secondary | ICD-10-CM | POA: Diagnosis not present

## 2018-12-28 DIAGNOSIS — I10 Essential (primary) hypertension: Secondary | ICD-10-CM

## 2018-12-28 DIAGNOSIS — E039 Hypothyroidism, unspecified: Secondary | ICD-10-CM

## 2018-12-28 DIAGNOSIS — E1169 Type 2 diabetes mellitus with other specified complication: Secondary | ICD-10-CM

## 2018-12-28 DIAGNOSIS — N926 Irregular menstruation, unspecified: Secondary | ICD-10-CM

## 2018-12-28 DIAGNOSIS — E119 Type 2 diabetes mellitus without complications: Secondary | ICD-10-CM | POA: Diagnosis not present

## 2018-12-28 DIAGNOSIS — E1159 Type 2 diabetes mellitus with other circulatory complications: Secondary | ICD-10-CM

## 2018-12-28 NOTE — Progress Notes (Signed)
Virtual Visit via Video Note  I connected with Sharon Wagner  on 12/28/18 at  2:30 PM EDT by a video enabled telemedicine application and verified that I am speaking with the correct person using two identifiers.  Location patient: home Location provider:work or home office Persons participating in the virtual visit: patient, provider  I discussed the limitations of evaluation and management by telemedicine and the availability of in person appointments. The patient expressed understanding and agreed to proceed.   Sharon Wagner DOB: [birth year] Encounter date: 12/28/2018  This is a 23 y.o. female who presents with No chief complaint on file.   History of present illness: A1C improved from 13.8 to 7.6 in last 3 months.   She is doing "good". States that sugars are staying low. Checked today was 125.   Not feeling shaky at all; not noted low sugars.   Has been trying to walk for exercise - jogging/walking around house.   Periods are doing ok. Has been normal for her; not having breakthrough bleeding. Last period started on 24th and pretty much ended today.   Last weight she remembers is 234. Still losing weight. Has lost over 30 lbs since starting to watch what she is eating.   Not back at work yet; she is supposed to go back maybe end of this month.     No Known Allergies No outpatient medications have been marked as taking for the 12/28/18 encounter (Office Visit) with Wynn Banker, MD.    Review of Systems  Constitutional: Negative for chills, fatigue and fever.  Respiratory: Negative for cough, chest tightness, shortness of breath and wheezing.   Cardiovascular: Negative for chest pain, palpitations and leg swelling.    Objective:  There were no vitals taken for this visit.      BP Readings from Last 3 Encounters:  10/24/18 122/64  10/10/18 130/80  09/26/18 124/64   Wt Readings from Last 3 Encounters:  12/17/18 241 lb (109.3 kg)   10/24/18 249 lb 1.6 oz (113 kg)  10/10/18 257 lb 12.8 oz (116.9 kg)    EXAM:  GENERAL: alert, oriented, appears well and in no acute distress  LUNGS: on inspection no signs of respiratory distress, breathing rate appears normal, no obvious gross SOB, gasping or wheezing  PSYCH/NEURO: pleasant and cooperative, no obvious depression or anxiety, speech and thought processing grossly intact   Assessment/Plan 1. Type 2 diabetes mellitus without complication, without long-term current use of insulin (HCC) She is doing very well with working on healthier eating.  She has had a significant amount of weight loss.  Encouraged her to keep up with regular exercise and healthy eating.  We will plan to do a recheck in the office in 3 months time.  2. Irregular menstrual cycle Periods have been lighter and more controlled since starting OCP.  Continue this.  3. Hypertension associated with diabetes (HCC) Has been well controlled.  Lisinopril added for renal protection.  We will continue to monitor.  As she loses weight, will need to monitor she may become more hypotensive.  4. Hypothyroidism, unspecified type Continue Synthroid.  We will plan to recheck with next set of blood work.  Return in about 3 months (around 03/30/2019) for physical exam.  I discussed the assessment and treatment plan with the patient. The patient was provided an opportunity to ask questions and all were answered. The patient agreed with the plan and demonstrated an understanding of the instructions.   The patient was advised  to call back or seek an in-person evaluation if the symptoms worsen or if the condition fails to improve as anticipated.  I provided 15 minutes of non-face-to-face time during this encounter.   Theodis ShoveJunell Theron Cumbie, MD

## 2019-01-01 ENCOUNTER — Telehealth: Payer: Self-pay | Admitting: *Deleted

## 2019-01-01 NOTE — Telephone Encounter (Signed)
-----   Message from Wynn Banker, MD sent at 12/28/2018 10:00 PM EDT ----- Please schedule blood work to be completed in the first week of September followed by a physical.  I talked with her about doing a diabetic visit, but I think schedule physical.  She already had a Pap smear so this will just involve follow-up on blood sugars and and brief physical exam.

## 2019-01-01 NOTE — Telephone Encounter (Signed)
I left a message at the pts home and cell number to return my call. 

## 2019-01-22 ENCOUNTER — Ambulatory Visit: Payer: Medicare Other | Admitting: *Deleted

## 2019-01-26 ENCOUNTER — Encounter: Payer: Self-pay | Admitting: Family Medicine

## 2019-01-29 ENCOUNTER — Telehealth: Payer: Self-pay

## 2019-01-29 DIAGNOSIS — E11649 Type 2 diabetes mellitus with hypoglycemia without coma: Secondary | ICD-10-CM

## 2019-01-29 NOTE — Telephone Encounter (Signed)
Copied from [place] [phone]. Topic: General - Other >> Jan 29, 2019  3:15 PM Leward Quan A wrote: Reason for CRM: Patient called to say that she test her blood sugar 3 times a day and have ran out of test strips. She is asking for a refill because the pharmacy refuse to refill early. Patient was reminded that orders read for her to test twice a day but she states that that is not enough. Please advise Ph# [phone]

## 2019-01-30 MED ORDER — ACCU-CHEK AVIVA VI STRP: ORAL_STRIP | 12 refills | Status: DC

## 2019-01-30 NOTE — Telephone Encounter (Signed)
Rx re-sent for test strips with instructions to check 3-4 times a day.  Unable to leave a message due to voicemail being full.

## 2019-02-11 ENCOUNTER — Other Ambulatory Visit: Payer: Self-pay | Admitting: Family Medicine

## 2019-02-11 NOTE — Telephone Encounter (Signed)
Looks like your rx went through to the pharmacy for testing streips with sugar checking 3-4 x daily as needed (I agree she doesn't have to check this much, but since on insulin and losing weight it is nice to have ability to check so we can adjust meds). Maybe she doesn't know we called in new rx? Check with pharmacy to make sure they got rx for strips and then let her know. If I missed something in your question let me know!

## 2019-03-23 ENCOUNTER — Other Ambulatory Visit: Payer: Self-pay | Admitting: Family Medicine

## 2019-03-23 DIAGNOSIS — E039 Hypothyroidism, unspecified: Secondary | ICD-10-CM

## 2019-03-23 DIAGNOSIS — E11649 Type 2 diabetes mellitus with hypoglycemia without coma: Secondary | ICD-10-CM

## 2019-04-19 ENCOUNTER — Ambulatory Visit: Payer: Medicare Other | Admitting: Family Medicine

## 2019-04-19 NOTE — Progress Notes (Deleted)
  Marline Backbone DOB: 02-Mar-1996 Encounter date: 04/19/2019  This is a 23 y.o. female who presents with No chief complaint on file.   History of present illness: Last visit was 12/28/18  DMII: lantus, metformin, glipizide [id number] 5mg  Hypothyroid: synthroid 86mcg  HPI   No Known Allergies No outpatient medications have been marked as taking for the 04/19/19 encounter (Appointment) with Caren Macadam, MD.    Review of Systems  Objective:  There were no vitals taken for this visit.      BP Readings from Last 3 Encounters:  10/24/18 122/64  10/10/18 130/80  09/26/18 124/64   Wt Readings from Last 3 Encounters:  12/17/18 241 lb (109.3 kg)  10/24/18 249 lb 1.6 oz (113 kg)  10/10/18 257 lb 12.8 oz (116.9 kg)    Physical Exam  Assessment/Plan  There are no diagnoses linked to this encounter.       Micheline Rough, MD

## 2019-04-20 ENCOUNTER — Encounter: Payer: Self-pay | Admitting: Family Medicine

## 2019-04-22 ENCOUNTER — Encounter: Payer: Self-pay | Admitting: Family Medicine

## 2019-04-22 ENCOUNTER — Ambulatory Visit (HOSPITAL_COMMUNITY)
Admission: EM | Admit: 2019-04-22 | Discharge: 2019-04-22 | Disposition: A | Discharge status: Home / Self Care | Payer: Medicare Other | Attending: Family Medicine | Admitting: Family Medicine

## 2019-04-22 ENCOUNTER — Telehealth (INDEPENDENT_AMBULATORY_CARE_PROVIDER_SITE_OTHER): Payer: Medicare Other | Admitting: Family Medicine

## 2019-04-22 ENCOUNTER — Encounter (HOSPITAL_COMMUNITY): Payer: Self-pay

## 2019-04-22 ENCOUNTER — Other Ambulatory Visit: Payer: Self-pay

## 2019-04-22 DIAGNOSIS — Z79890 Hormone replacement therapy (postmenopausal): Secondary | ICD-10-CM | POA: Insufficient documentation

## 2019-04-22 DIAGNOSIS — J029 Acute pharyngitis, unspecified: Secondary | ICD-10-CM | POA: Insufficient documentation

## 2019-04-22 DIAGNOSIS — E1159 Type 2 diabetes mellitus with other circulatory complications: Secondary | ICD-10-CM | POA: Insufficient documentation

## 2019-04-22 DIAGNOSIS — R0789 Other chest pain: Secondary | ICD-10-CM | POA: Diagnosis not present

## 2019-04-22 DIAGNOSIS — E039 Hypothyroidism, unspecified: Secondary | ICD-10-CM | POA: Diagnosis not present

## 2019-04-22 DIAGNOSIS — R509 Fever, unspecified: Secondary | ICD-10-CM | POA: Diagnosis not present

## 2019-04-22 DIAGNOSIS — R6889 Other general symptoms and signs: Secondary | ICD-10-CM | POA: Diagnosis present

## 2019-04-22 DIAGNOSIS — Z794 Long term (current) use of insulin: Secondary | ICD-10-CM | POA: Diagnosis not present

## 2019-04-22 DIAGNOSIS — R059 Cough, unspecified: Secondary | ICD-10-CM

## 2019-04-22 DIAGNOSIS — Z79899 Other long term (current) drug therapy: Secondary | ICD-10-CM | POA: Diagnosis not present

## 2019-04-22 DIAGNOSIS — J069 Acute upper respiratory infection, unspecified: Secondary | ICD-10-CM

## 2019-04-22 DIAGNOSIS — J3489 Other specified disorders of nose and nasal sinuses: Secondary | ICD-10-CM | POA: Diagnosis not present

## 2019-04-22 DIAGNOSIS — Z20822 Contact with and (suspected) exposure to covid-19: Secondary | ICD-10-CM

## 2019-04-22 DIAGNOSIS — R05 Cough: Secondary | ICD-10-CM | POA: Insufficient documentation

## 2019-04-22 DIAGNOSIS — R0602 Shortness of breath: Secondary | ICD-10-CM | POA: Diagnosis not present

## 2019-04-22 DIAGNOSIS — Z793 Long term (current) use of hormonal contraceptives: Secondary | ICD-10-CM | POA: Diagnosis not present

## 2019-04-22 DIAGNOSIS — Z20828 Contact with and (suspected) exposure to other viral communicable diseases: Secondary | ICD-10-CM

## 2019-04-22 DIAGNOSIS — Z833 Family history of diabetes mellitus: Secondary | ICD-10-CM | POA: Insufficient documentation

## 2019-04-22 LAB — POCT RAPID STREP A: Streptococcus, Group A Screen (Direct): NEGATIVE

## 2019-04-22 NOTE — Progress Notes (Signed)
Virtual Visit via Video Note  I connected with the patient on 04/22/19 at  3:30 PM EDT by a video enabled telemedicine application and verified that I am speaking with the correct person using two identifiers.  Location patient: home Location provider:work or home office Persons participating in the virtual visit: patient, provider  I discussed the limitations of evaluation and management by telemedicine and the availability of in person appointments. The patient expressed understanding and agreed to proceed.   HPI: Here for 2 days of fever, stuffy head, PND, ST, and a dry cough. No headache or body aches. No SOB or chest pain. No NVD. No loss of taste or smell. Using Robitussin DM.    ROS: See pertinent positives and negatives per HPI.  Past Medical History:  Diagnosis Date  . Allergy   . Diabetes mellitus without complication [facility])     Past Surgical History:  Procedure Laterality Date  . ADENOIDECTOMY      Family History  Problem Relation Age of Onset  . Heart disease Paternal Grandfather   . Diabetes Father   . Heart disease Father   . Hypertension Father   . Stroke Father 40       mini stroke  . High Cholesterol Father   . Diabetes Paternal Grandmother   . Arthritis Paternal Grandmother   . Early death Mother        lung issues; heavy smoker. Uncertain exact cause of death  . Early death Maternal Grandmother        smoking related     Current Outpatient Medications:  .  Accu-Chek FastClix Lancets MISC, USE UP TO FOUR TIMES A DAY AS DIRECTED, Disp: 100 each, Rfl: 1 .  Blood Glucose Monitoring Suppl (ACCU-CHEK AVIVA PLUS) w/Device KIT, USE METER FOR UP TO FOUR TIMES A DAY, Disp: 1 kit, Rfl: 0 .  glipiZIDE (GLUCOTROL) 5 MG tablet, TAKE 1/2 TABLET BY MOUTH DAILY BEFORE BREAKFAST, Disp: 15 tablet, Rfl: 5 .  glucose blood (ACCU-CHEK AVIVA) test strip, Check blood sugars 3-4 times daily, Disp: 100 each, Rfl: 12 .  Insulin Glargine (LANTUS SOLOSTAR) 100 UNIT/ML Solostar  Pen, Inject 5-10 Units into the skin at bedtime., Disp: 3 pen, Rfl: 3 .  Insulin Pen Needle (B-D ULTRAFINE III SHORT PEN) 31G X 8 MM MISC, 1 Units by Does not apply route at bedtime., Disp: 90 each, Rfl: 1 .  levothyroxine (SYNTHROID) 25 MCG tablet, TAKE 1 TABLET BY MOUTH DAILY BEFORE BREAKFAST, Disp: 30 tablet, Rfl: 5 .  lisinopril (ZESTRIL) 5 MG tablet, TAKE 1/2 TABLET BY MOUTH DAILY, Disp: 45 tablet, Rfl: 0 .  metFORMIN (GLUCOPHAGE-XR) 500 MG 24 hr tablet, Take 1 tablet (500 mg total) by mouth daily., Disp: 30 tablet, Rfl: 5 .  norethindrone-ethinyl estradiol (JUNEL FE,GILDESS FE,LOESTRIN FE) 1-20 MG-MCG tablet, Take 1 tablet by mouth daily., Disp: 1 Package, Rfl: 11  EXAM:  VITALS per patient if applicable:  GENERAL: alert, oriented, appears well and in no acute distress  HEENT: atraumatic, conjunttiva clear, no obvious abnormalities on inspection of external nose and ears  NECK: normal movements of the head and neck  LUNGS: on inspection no signs of respiratory distress, breathing rate appears normal, no obvious gross SOB, gasping or wheezing  CV: no obvious cyanosis  MS: moves all visible extremities without noticeable abnormality  PSYCH/NEURO: pleasant and cooperative, no obvious depression or anxiety, speech and thought processing grossly intact  ASSESSMENT AND PLAN: This sounds like a viral URI. She will rest and drink  fluids. Take Tylenol prn. We will write her out of work this week. I urged her to also get tested for the Covid-19 virus and she agreed. She plans [facility].  Alysia Penna, MD  Discussed the following assessment and plan:  No diagnosis found.     I discussed the assessment and treatment plan with the patient. The patient was provided an opportunity to ask questions and all were answered. The patient agreed with the plan and demonstrated an understanding of the instructions.   The patient was advised to call back or seek an  in-person evaluation if the symptoms worsen or if the condition fails to improve as anticipated.

## 2019-04-22 NOTE — ED Triage Notes (Signed)
Pt presents with non productive cough, nasal drainage, chest discomfort, fever, and shortness of breath since yesterday.

## 2019-04-22 NOTE — Discharge Instructions (Addendum)
Your rapid strep test was negative.  A throat culture is pending and we will call you if it is positive.    Your COVID test is pending.  You should self quarantine until your test result is back and is negative.    Go to the emergency department if you develop high fever, shortness of breath, severe diarrhea, or other concerning symptoms.

## 2019-04-22 NOTE — ED Provider Notes (Signed)
[name]    CSN: [id number] Arrival date & time: 04/22/19  1700      History   Chief Complaint Chief Complaint  Patient presents with  . Shortness of Breath  . Cough  . Chest Discomfort  . Nasal Drainage    HPI Sharon Wagner is a 23 y.o. female.   Patient presents with fever, sore throat, nonproductive cough, shortness of breath, and rhinorrhea x 1 day.  She denies abdominal pain, vomiting, diarrhea, rash, or other symptoms.  LMP: 1 week.  No treatments attempted at home.  She states she was directed by her PCP to come here for a COVID test.  The history is provided by the patient.    Past Medical History:  Diagnosis Date  . Allergy   . Diabetes mellitus without complication [facility])     Patient Active Problem List   Diagnosis Date Noted  . Hypothyroidism 04/07/2015  . Routine general medical examination at a health care facility 11/27/2014  . Type 2 diabetes mellitus without complication ([place]) 38/75/6433  . Hypertension associated with diabetes ([place]) 10/31/2013  . Morbid obesity ([place]) 10/31/2013  . Irregular menstrual cycle 10/31/2013    Past Surgical History:  Procedure Laterality Date  . ADENOIDECTOMY      OB History   No obstetric history on file.      Home Medications    Prior to Admission medications   Medication Sig Start Date End Date Taking? Authorizing Provider  Accu-Chek FastClix Lancets MISC USE UP TO FOUR TIMES A DAY AS DIRECTED 10/24/18   Koberlein, Steele Berg, MD  Blood Glucose Monitoring Suppl (ACCU-CHEK AVIVA PLUS) w/Device KIT USE METER FOR UP TO FOUR TIMES A DAY 03/25/19   Koberlein, Junell C, MD  glipiZIDE (GLUCOTROL) 5 MG tablet TAKE 1/2 TABLET BY MOUTH DAILY BEFORE BREAKFAST 03/25/19   Koberlein, Junell C, MD  glucose blood (ACCU-CHEK AVIVA) test strip Check blood sugars 3-4 times daily 01/30/19   Koberlein, Andris Flurry C, MD  Insulin Glargine (LANTUS SOLOSTAR) 100 UNIT/ML Solostar Pen Inject 5-10 Units into the skin at  bedtime. 10/24/18   Koberlein, Steele Berg, MD  Insulin Pen Needle (B-D ULTRAFINE III SHORT PEN) 31G X 8 MM MISC 1 Units by Does not apply route at bedtime. 10/24/18   Caren Macadam, MD  levothyroxine (SYNTHROID) 25 MCG tablet TAKE 1 TABLET BY MOUTH DAILY BEFORE BREAKFAST 03/25/19   Koberlein, Junell C, MD  lisinopril (ZESTRIL) 5 MG tablet TAKE 1/2 TABLET BY MOUTH DAILY 02/07/19   Koberlein, Steele Berg, MD  metFORMIN (GLUCOPHAGE-XR) 500 MG 24 hr tablet Take 1 tablet (500 mg total) by mouth daily. 12/17/18   Caren Macadam, MD  norethindrone-ethinyl estradiol (JUNEL FE,GILDESS FE,LOESTRIN FE) 1-20 MG-MCG tablet Take 1 tablet by mouth daily. 10/24/18   Caren Macadam, MD    Family History Family History  Problem Relation Age of Onset  . Heart disease Paternal Grandfather   . Diabetes Father   . Heart disease Father   . Hypertension Father   . Stroke Father 40       mini stroke  . High Cholesterol Father   . Diabetes Paternal Grandmother   . Arthritis Paternal Grandmother   . Early death Mother        lung issues; heavy smoker. Uncertain exact cause of death  . Early death Maternal Grandmother        smoking related    Social History Social History   Tobacco Use  . Smoking  status: Never Smoker  . Smokeless tobacco: Never Used  Substance Use Topics  . Alcohol use: No  . Drug use: No     Allergies   Patient has no known allergies.   Review of Systems Review of Systems  Constitutional: Positive for fever. Negative for chills.  HENT: Positive for congestion, postnasal drip, rhinorrhea and sore throat. Negative for ear pain.   Eyes: Negative for pain and visual disturbance.  Respiratory: Positive for cough and shortness of breath.   Cardiovascular: Negative for chest pain and palpitations.  Gastrointestinal: Negative for abdominal pain and vomiting.  Genitourinary: Negative for dysuria and hematuria.  Musculoskeletal: Negative for arthralgias and back pain.  Skin:  Negative for color change and rash.  Neurological: Negative for seizures and syncope.  All other systems reviewed and are negative.    Physical Exam Triage Vital Signs ED Triage Vitals  Enc Vitals Group     BP      Pulse      Resp      Temp      Temp src      SpO2      Weight      Height      Head Circumference      Peak Flow      Pain Score      Pain Loc      Pain Edu?      Excl. in [place]?    No data found.  Updated Vital Signs BP 127/75 (BP Location: Left Arm)   Pulse (!) 103   Temp 100 F (37.8 C) (Oral)   Resp 17   SpO2 100%   Visual Acuity Right Eye Distance:   Left Eye Distance:   Bilateral Distance:    Right Eye Near:   Left Eye Near:    Bilateral Near:     Physical Exam Vitals signs and nursing note reviewed.  Constitutional:      General: She is not in acute distress.    Appearance: She is well-developed. She is not ill-appearing.  HENT:     Head: Normocephalic and atraumatic.     Right Ear: Tympanic membrane normal.     Left Ear: Tympanic membrane normal.     Nose: Congestion and rhinorrhea present.     Mouth/Throat:     Mouth: Mucous membranes are moist.     Pharynx: Oropharynx is clear.  Eyes:     Conjunctiva/sclera: Conjunctivae normal.  Neck:     Musculoskeletal: Neck supple.  Cardiovascular:     Rate and Rhythm: Normal rate and regular rhythm.     Heart sounds: No murmur.  Pulmonary:     Effort: Pulmonary effort is normal. No respiratory distress.     Breath sounds: Normal breath sounds. No wheezing or rhonchi.  Abdominal:     General: Bowel sounds are normal.     Palpations: Abdomen is soft.     Tenderness: There is no abdominal tenderness. There is no guarding or rebound.  Skin:    General: Skin is warm and dry.     Findings: No rash.  Neurological:     Mental Status: She is alert.      UC Treatments / Results  Labs (all labs ordered are listed, but only abnormal results are displayed) Labs Reviewed  NOVEL CORONAVIRUS,  NAA (HOSP ORDER, SEND-OUT TO REF LAB; TAT 18-24 HRS)  CULTURE, GROUP A STREP [facility])  POCT RAPID STREP A    EKG   Radiology No results  found.  Procedures Procedures (including critical care time)  Medications Ordered in UC Medications - No data to display  Initial Impression / Assessment and Plan / UC Course  I have reviewed the triage vital signs and the nursing notes.  Pertinent labs & imaging results that were available during my care of the patient were reviewed by me and considered in my medical decision making (see chart for details).    Cough, sore throat, suspected COVID.  Rapid strep negative; culture pending.  COVID test performed here.  Instructed patient to self quarantine until her test results are back.  Instructed patient to go to the emergency department if she develops high fever, shortness of breath, severe diarrhea, or other concerning symptoms.  Patient agrees with plan of care.    Final Clinical Impressions(s) / UC Diagnoses   Final diagnoses:  Cough  Sore throat  Suspected Covid-19 Virus Infection     Discharge Instructions     Your rapid strep test was negative.  A throat culture is pending and we will call you if it is positive.    Your COVID test is pending.  You should self quarantine until your test result is back and is negative.    Go to the emergency department if you develop high fever, shortness of breath, severe diarrhea, or other concerning symptoms.        ED Prescriptions    None     PDMP not reviewed this encounter.   Sharion Balloon, NP 04/22/19 1910

## 2019-04-24 LAB — NOVEL CORONAVIRUS, NAA (HOSP ORDER, SEND-OUT TO REF LAB; TAT 18-24 HRS): SARS-CoV-2, NAA: NOT DETECTED

## 2019-04-25 LAB — CULTURE, GROUP A STREP (THRC)

## 2019-04-29 ENCOUNTER — Other Ambulatory Visit: Payer: Self-pay | Admitting: Family Medicine

## 2019-04-29 DIAGNOSIS — E11649 Type 2 diabetes mellitus with hypoglycemia without coma: Secondary | ICD-10-CM

## 2019-05-06 ENCOUNTER — Other Ambulatory Visit: Payer: Self-pay | Admitting: Family Medicine

## 2019-05-06 DIAGNOSIS — E11649 Type 2 diabetes mellitus with hypoglycemia without coma: Secondary | ICD-10-CM

## 2019-05-06 MED ORDER — LANTUS SOLOSTAR 100 UNIT/ML ~~LOC~~ SOPN: 5.0000 [IU] | PEN_INJECTOR | Freq: Every day | SUBCUTANEOUS | 3 refills | Status: DC

## 2019-05-06 MED ORDER — GLIPIZIDE 5 MG PO TABS: ORAL_TABLET | ORAL | 5 refills | Status: DC

## 2019-05-06 NOTE — Telephone Encounter (Signed)
Medication Refill - Medication: glipiZIDE and Insulin Glargine     Has the patient contacted their pharmacy? Yes  (Agent: If no, request that the patient contact the pharmacy for the refill.) (Agent: If yes, when and what did the pharmacy advise?) told her to contact practice directly   Preferred Pharmacy (with phone number or street name):  [facility] [phone] - [address]  [place] [address]  Phone: [phone] Fax: [phone]  Not a 24 hour pharmacy; exact hours not known.     Agent: Please be advised that RX refills may take up to 3 business days. We ask that you follow-up with your pharmacy.

## 2019-05-13 ENCOUNTER — Telehealth: Payer: Self-pay | Admitting: Family Medicine

## 2019-05-13 NOTE — Telephone Encounter (Signed)
leann calling from the Pharmacy called and stated that pt needs a CMM filled out for refill of test strips. Please fax to pharmacy. Please advise    [phone]

## 2019-05-14 NOTE — Telephone Encounter (Signed)
Form completed and faxed to [phone].

## 2019-05-25 ENCOUNTER — Other Ambulatory Visit: Payer: Self-pay | Admitting: Family Medicine

## 2019-05-25 DIAGNOSIS — E11649 Type 2 diabetes mellitus with hypoglycemia without coma: Secondary | ICD-10-CM

## 2019-05-27 ENCOUNTER — Other Ambulatory Visit: Payer: Self-pay | Admitting: Family Medicine

## 2019-05-27 DIAGNOSIS — E11649 Type 2 diabetes mellitus with hypoglycemia without coma: Secondary | ICD-10-CM

## 2019-06-06 ENCOUNTER — Ambulatory Visit: Payer: Self-pay | Admitting: *Deleted

## 2019-06-06 ENCOUNTER — Encounter: Payer: Self-pay | Admitting: Family Medicine

## 2019-06-06 ENCOUNTER — Other Ambulatory Visit: Payer: Self-pay

## 2019-06-06 ENCOUNTER — Telehealth (INDEPENDENT_AMBULATORY_CARE_PROVIDER_SITE_OTHER): Payer: Medicare Other | Admitting: Family Medicine

## 2019-06-06 VITALS — BP 138/76 | HR 125 | Temp 100.4°F

## 2019-06-06 DIAGNOSIS — Z20822 Contact with and (suspected) exposure to covid-19: Secondary | ICD-10-CM

## 2019-06-06 DIAGNOSIS — Z20828 Contact with and (suspected) exposure to other viral communicable diseases: Secondary | ICD-10-CM

## 2019-06-06 DIAGNOSIS — E1159 Type 2 diabetes mellitus with other circulatory complications: Secondary | ICD-10-CM

## 2019-06-06 DIAGNOSIS — E119 Type 2 diabetes mellitus without complications: Secondary | ICD-10-CM | POA: Diagnosis not present

## 2019-06-06 DIAGNOSIS — R519 Headache, unspecified: Secondary | ICD-10-CM | POA: Diagnosis not present

## 2019-06-06 DIAGNOSIS — E039 Hypothyroidism, unspecified: Secondary | ICD-10-CM | POA: Diagnosis not present

## 2019-06-06 DIAGNOSIS — I10 Essential (primary) hypertension: Secondary | ICD-10-CM | POA: Diagnosis not present

## 2019-06-06 DIAGNOSIS — I152 Hypertension secondary to endocrine disorders: Secondary | ICD-10-CM

## 2019-06-06 NOTE — Telephone Encounter (Signed)
I called the pt and scheduled a virtual visit for today at 6pm with Dr Maudie Mercury.

## 2019-06-06 NOTE — Patient Instructions (Signed)
-take all medications as prescribed by your doctor  -call your pharmacy to request a refill on your Synthroid/levothyroxine  -seek care at the urgent care or the emergency room if you have fevers, severe headache, worsening or new symptoms or concerns  -follow up with a virtual visit with your doctor next week   Self Isolation/Home Quarantine: -see the CDC site for information:   DiscoHelp.si.html   -STAY HOME except for to seek medical care -stay in your own room away from others in your house and use a separate bathroom if possible -Wash hands frequently, disinfect high touch surface areas often, wear a mask if you leave your room and interact as little as possible with others -seek medical care immediately if worsening - call our office for a visit or call ahead if going elsewhere to an urgent care  -seek emergency care if very sick or severe symptoms - call 911 -isolate for at least 10 days from the onset of symptoms PLUS 3 days of no fever PLUS 3 days of improving symptoms  We recommend wearing a mask, social distancing, good hand hygiene and asking others  around you to do the same at all times when around others outside of your home unit throughout the COVID19 pandemic.    Novel Coronavirus Testing: I sent an order for coronavirus testing. No Appointment is needed.  Testing Sites:    [facility] Location:                            [address], [place]                                              Campus ([facility]) Hours:                                 8a-3:45p, M-F  [facility] Location:                           [address], [place], [place] [address]                                                              [address] [facility]) Hours:                                 8a-3:45p, M-F  [facility] Location:                            Medical illustrator  (across from [facility] ED/[place]) Hours:                                 8a-3:45p, M-F  Positive test. These tests are not 100% perfect, but if you tested positive for COVID-19, this confirms that you have contracted the SARS-CoV-2 virus. STAY HOME to complete full  Quarantine per [employer] guidelines.  Negative test. These tests are not 100% perfect but if you tested negative for COVID-19, this indicates that you may not have contracted the SARS-CoV-2 virus. Follow your doctor's recommendations and the CDC guidelines.

## 2019-06-06 NOTE — Telephone Encounter (Signed)
Left a message for the pt to return my call.  CRM also created and message sent to Dr Maudie Mercury.

## 2019-06-06 NOTE — Telephone Encounter (Signed)
Pt called to c/o having a severe headache. She denies blurred vision or weakness but states she has had some dizziness. She has not checked her b/p yet and has missed some doses of her b/p medication. When asking her questions, she relied on someone in the room to assist with answering the questions. She states that when she is awake and up, her head hurts but when she is asleep it does not. She is not sure if it is a migraine. Advised that she should take medication for her headache and to get up slowly to prevent falls.  Also to check her b/p to make sure it is not elevated. To call back for an elevated b/p, weakness, blurred vision or any other symptom. She voiced understanding. Will notify the office for an appointment. Routing to [facility] at [place] for review and an appointment.  Reason for Disposition . [1] MODERATE headache (e.g., interferes with normal activities) AND [2] present > 24 hours AND [3] unexplained  (Exceptions: analgesics not tried, typical migraine, or headache part of viral illness)  Answer Assessment - Initial Assessment Questions 1. LOCATION: "Where does it hurt?"      In the middle 2. ONSET: "When did the headache start?" (Minutes, hours or days)      Early this morning 3. PATTERN: "Does the pain come and go, or has it been constant since it started?"     Comes and goes and gets worst 4. SEVERITY: "How bad is the pain?" and "What does it keep you from doing?"  (e.g., Scale 1-10; mild, moderate, or severe)   - MILD (1-3): doesn't interfere with normal activities    - MODERATE (4-7): interferes with normal activities or awakens from sleep    - SEVERE (8-10): excruciating pain, unable to do any normal activities        Pain # 10 5. RECURRENT SYMPTOM: "Have you ever had headaches before?" If so, ask: "When was the last time?" and "What happened that time?"      Yes and had headaches like this before in June or July 6. CAUSE: "What do you think is causing the headache?"    Not sure 7. MIGRAINE: "Have you been diagnosed with migraine headaches?" If so, ask: "Is this headache similar?"      possibley 8. HEAD INJURY: "Has there been any recent injury to the head?"      no 9. OTHER SYMPTOMS: "Do you have any other symptoms?" (fever, stiff neck, eye pain, sore throat, cold symptoms)     Whole body is sore, chills 10. PREGNANCY: "Is there any chance you are pregnant?" "When was your last menstrual period?"       Not pregnant. Last period started last Saturday  Protocols used: HEADACHE-A-AH

## 2019-06-06 NOTE — Telephone Encounter (Signed)
Just saw "severe" headache on the schedule and read nurse visit. If truly "severe", 10/10 pain, and with other symptoms such as can't talk and/or dizzy as note suggests, advise needs to go for in-person evaluation at the ER. Thank you.

## 2019-06-06 NOTE — Progress Notes (Signed)
Virtual Visit via Video Note  I connected with Sharon Wagner on 06/06/19 at  6:00 PM EST by a video enabled telemedicine application and verified that I am speaking with the correct person using two identifiers.  Location patient: home Location provider:work or home office Persons participating in the virtual visit: patient, provider  I discussed the limitations of evaluation and management by telemedicine and the availability of in person appointments. The patient expressed understanding and agreed to proceed.   HPI:  Acute sick visit: -Symptoms started yesterday and included headache, body aches, fatigue and ? fever -she reports she has had similar headaches in the past -BS at home 185 last night - she had been out of her glipizide and blood pressure medications for 3 weeks, back on her medications today -temp 99, Now the headache has resolved after taking an aleve, temp was 100.7 once, but then was 97.8 when rechecked, BP 133/79 - this was before restarting her medication -denies weakness, numbness, vision changes, speech changes -her boyfriend is waiting on COVID19 testing because of an outbreak at work - but he has not had symptoms -she works at [employer] but reports she is not near anyone without double masking at all times -PMH of DM, HTN, HLD, Hypothyroidism and Obesity   ROS: See pertinent positives and negatives per HPI.  Past Medical History:  Diagnosis Date  . Allergy   . Diabetes mellitus without complication [facility])     Past Surgical History:  Procedure Laterality Date  . ADENOIDECTOMY      Family History  Problem Relation Age of Onset  . Heart disease Paternal Grandfather   . Diabetes Father   . Heart disease Father   . Hypertension Father   . Stroke Father 40       mini stroke  . High Cholesterol Father   . Diabetes Paternal Grandmother   . Arthritis Paternal Grandmother   . Early death Mother        lung issues; heavy smoker. Uncertain exact cause of death   . Early death Maternal Grandmother        smoking related    SOCIAL HX: see hpi   Current Outpatient Medications:  .  Accu-Chek FastClix Lancets MISC, USE UP TO FOUR TIMES A DAY AS DIRECTED, Disp: 100 each, Rfl: 1 .  B-D ULTRAFINE III SHORT PEN 31G X 8 MM MISC, USE AS DIRECTED AT BEDTIME, Disp: 100 each, Rfl: 0 .  Blood Glucose Monitoring Suppl (ACCU-CHEK AVIVA PLUS) w/Device KIT, USE METER FOR UP TO FOUR TIMES A DAY, Disp: 1 kit, Rfl: 0 .  glipiZIDE (GLUCOTROL) 5 MG tablet, TAKE 1/2 TABLET BY MOUTH DAILY BEFORE BREAKFAST, Disp: 15 tablet, Rfl: 5 .  glucose blood (ACCU-CHEK AVIVA) test strip, Check blood sugars 3-4 times daily, Disp: 100 each, Rfl: 12 .  Insulin Glargine (LANTUS SOLOSTAR) 100 UNIT/ML Solostar Pen, Inject 5-10 Units into the skin at bedtime., Disp: 3 pen, Rfl: 3 .  levothyroxine (SYNTHROID) 25 MCG tablet, TAKE 1 TABLET BY MOUTH DAILY BEFORE BREAKFAST, Disp: 30 tablet, Rfl: 5 .  lisinopril (ZESTRIL) 5 MG tablet, TAKE 1/2 TABLET BY MOUTH DAILY, Disp: 45 tablet, Rfl: 0 .  metFORMIN (GLUCOPHAGE-XR) 500 MG 24 hr tablet, TAKE 1 TABLET BY MOUTH EVERY DAY, Disp: 90 tablet, Rfl: 0 .  norethindrone-ethinyl estradiol (JUNEL FE,GILDESS FE,LOESTRIN FE) 1-20 MG-MCG tablet, Take 1 tablet by mouth daily., Disp: 1 Package, Rfl: 11  EXAM:  VITALS per patient if applicable: temp 99  GENERAL: alert, oriented,  appears well and in no acute distress  HEENT: atraumatic, conjunttiva clear, no obvious abnormalities on inspection of external nose and ears, pupils equal and round and EOMI, Moist mucus membranes on video visit inspection of oropharynx  NECK: normal movements of the head and neck, chint to chest and neck ext normal without symptoms  LUNGS: on inspection no signs of respiratory distress, breathing rate appears normal, no obvious gross SOB, gasping or wheezing  CV: no obvious cyanosis  MS: moves all visible extremities without noticeable abnormality  PSYCH/NEURO: pleasant and  cooperative, no obvious depression or anxiety, speech and thought processing grossly intact  ASSESSMENT AND PLAN:  Discussed the following assessment and plan:  Nonintractable headache, unspecified chronicity pattern, unspecified headache type  Suspected COVID-19 virus infection - Plan: Novel Coronavirus, NAA ([facility])  Hypertension associated with diabetes ([place])  Morbid obesity (HCC)  Type 2 diabetes mellitus without complication, without long-term current use of insulin (HCC)  Hypothyroidism, unspecified type  -we discussed possible serious and likely etiologies, options for evaluation and workup, limitations of telemedicine visit vs in person visit, treatment, treatment risks and precautions. Pt prefers to treat via telemedicine empirically rather then risking or undertaking an in person visit at this moment. She currently is afebrile and is not have any symptoms. It is unclear if she had a fever or an erroneous measurement on the thermometer. Advised given her multiple co morbidities and possible COVID exposure that she get COVID19 testing, ordered and also seek in-person care if symptoms return, worsen or new symptoms arise. Reviewed all of her medications and current pill bottles and she had a poor understanding of her medication list. It seems as of today she is back on lisinopril and glipizide, had been taking insulin and metformin. She did not know she was supposed to be taking synthroid and reports the pharmacy did not give it to her. Sent a message to her PCP nurse to check with pharamcy and also advised the patient to call the pharmacy to request her synthroid. Advised PCP follow up next week - sent message to scheduling to assist. Patient agrees to seek prompt in person care if worsening, new symptoms arise, recurrence of symptoms.   I discussed the assessment and treatment plan with the patient. The patient was provided an opportunity to ask questions and all were answered. The  patient agreed with the plan and demonstrated an understanding of the instructions.   The patient was advised to call back or seek an in-person evaluation if the symptoms worsen or if the condition fails to improve as anticipated.   Lucretia Kern, DO   Patient Instructions  -take all medications as prescribed by your doctor  -call your pharmacy to request a refill on your Synthroid/levothyroxine  -seek care at the urgent care or the emergency room if you have fevers, severe headache, worsening or new symptoms or concerns  -follow up with a virtual visit with your doctor next week   Self Isolation/Home Quarantine: -see the CDC site for information:   [email or web address].za.html   -STAY HOME except for to seek medical care -stay in your own room away from others in your house and use a separate bathroom if possible -Wash hands frequently, disinfect high touch surface areas often, wear a mask if you leave your room and interact as little as possible with others -seek medical care immediately if worsening - call our office for a visit or call ahead if going elsewhere to an urgent care  -seek emergency care  if very sick or severe symptoms - call 911 -isolate for at least 10 days from the onset of symptoms PLUS 3 days of no fever PLUS 3 days of improving symptoms  We recommend wearing a mask, social distancing, good hand hygiene and asking others  around you to do the same at all times when around others outside of your home unit throughout the [place] pandemic.    Novel Coronavirus Testing: I sent an order for coronavirus testing. No Appointment is needed.  Testing Sites:    [facility] Location:                            [address] [place] (old [facility]) Hours:                                 8a-3:45p, M-F  [facility] Location:                            [address], [place], [place] [address]                                                              [place] ([place]) Hours:                                 8a-3:45p, M-F  [facility] Location:                            [place], [place] (across from [place]) Hours:                                 8a-3:45p, M-F  Positive test. These tests are not 100% perfect, but if you tested positive for COVID-19, this confirms that you have contracted the SARS-CoV-2 virus. STAY HOME to complete full Quarantine per CDC guidelines.  Negative test. These tests are not 100% perfect but if you tested negative for COVID-19, this indicates that you may not have contracted the SARS-CoV-2 virus. Follow your doctor's recommendations and the CDC guidelines.

## 2019-06-07 ENCOUNTER — Telehealth: Payer: Self-pay | Admitting: *Deleted

## 2019-06-07 NOTE — Telephone Encounter (Signed)
-----   Message from Lucretia Kern, DO sent at 06/06/2019  6:19 PM EST ----- Pleas check with pharmacy as pt reports she called for refill of all meds but is having issues getting her synthroid refilled. Thanks!

## 2019-06-07 NOTE — Telephone Encounter (Signed)
I called [facility] and spoke with Leann Environmental education officer). She stated the refill is ready and the pt will receive an automatic call with this information.

## 2019-06-08 ENCOUNTER — Encounter (HOSPITAL_COMMUNITY): Payer: Self-pay

## 2019-06-08 ENCOUNTER — Ambulatory Visit (HOSPITAL_COMMUNITY)
Admission: EM | Admit: 2019-06-08 | Discharge: 2019-06-08 | Disposition: A | Discharge status: Home / Self Care | Payer: Medicare Other | Attending: Emergency Medicine | Admitting: Emergency Medicine

## 2019-06-08 ENCOUNTER — Other Ambulatory Visit: Payer: Self-pay

## 2019-06-08 DIAGNOSIS — Z20822 Contact with and (suspected) exposure to covid-19: Secondary | ICD-10-CM

## 2019-06-08 DIAGNOSIS — Z79899 Other long term (current) drug therapy: Secondary | ICD-10-CM | POA: Diagnosis not present

## 2019-06-08 DIAGNOSIS — R0602 Shortness of breath: Secondary | ICD-10-CM

## 2019-06-08 DIAGNOSIS — U071 COVID-19: Secondary | ICD-10-CM | POA: Diagnosis not present

## 2019-06-08 DIAGNOSIS — E1159 Type 2 diabetes mellitus with other circulatory complications: Secondary | ICD-10-CM | POA: Diagnosis not present

## 2019-06-08 DIAGNOSIS — R509 Fever, unspecified: Secondary | ICD-10-CM

## 2019-06-08 DIAGNOSIS — R05 Cough: Secondary | ICD-10-CM | POA: Diagnosis not present

## 2019-06-08 DIAGNOSIS — Z794 Long term (current) use of insulin: Secondary | ICD-10-CM | POA: Insufficient documentation

## 2019-06-08 DIAGNOSIS — Z20828 Contact with and (suspected) exposure to other viral communicable diseases: Secondary | ICD-10-CM

## 2019-06-08 DIAGNOSIS — E039 Hypothyroidism, unspecified: Secondary | ICD-10-CM | POA: Diagnosis not present

## 2019-06-08 DIAGNOSIS — I10 Essential (primary) hypertension: Secondary | ICD-10-CM | POA: Diagnosis not present

## 2019-06-08 DIAGNOSIS — R059 Cough, unspecified: Secondary | ICD-10-CM

## 2019-06-08 MED ORDER — ACETAMINOPHEN 325 MG PO TABS
ORAL_TABLET | ORAL | Status: AC
  Filled 2019-06-08: qty 2

## 2019-06-08 MED ORDER — ACETAMINOPHEN 325 MG PO TABS
650.0000 mg | ORAL_TABLET | Freq: Once | ORAL | Status: AC
  Administered 2019-06-08: 12:00:00 650 mg via ORAL

## 2019-06-08 NOTE — ED Provider Notes (Signed)
[place]    CSN: [id number] Arrival date & time: 06/08/19  1139      History   Chief Complaint Chief Complaint  Patient presents with  . Shortness of Breath    HPI Sharon Wagner is a 23 y.o. female.   Patient presents with 1 day history of fever, shortness of breath, nonproductive cough.  She was sent home from work today with a fever of 101; she took acetaminophen at that time.  She had a COVID test ordered on 06/06/2019 but did not get it done.  She is here today requesting a COVID test.  She denies congestion, rhinorrhea, sore throat, vomiting, diarrhea, or other symptoms.  Her medical history is significant for diabetes, hypertension, morbid obesity, hypothyroidism.  The history is provided by the patient.    Past Medical History:  Diagnosis Date  . Allergy   . Diabetes mellitus without complication [facility])     Patient Active Problem List   Diagnosis Date Noted  . Hypothyroidism 04/07/2015  . Routine general medical examination at a health care facility 11/27/2014  . Type 2 diabetes mellitus without complication ([place]) 62/37/6283  . Hypertension associated with diabetes ([place]) 10/31/2013  . Morbid obesity ([place]) 10/31/2013  . Irregular menstrual cycle 10/31/2013    Past Surgical History:  Procedure Laterality Date  . ADENOIDECTOMY      OB History   No obstetric history on file.      Home Medications    Prior to Admission medications   Medication Sig Start Date End Date Taking? Authorizing Provider  Accu-Chek FastClix Lancets MISC USE UP TO FOUR TIMES A DAY AS DIRECTED 10/24/18   Koberlein, Steele Berg, MD  B-D ULTRAFINE III SHORT PEN 31G X 8 MM MISC USE AS DIRECTED AT BEDTIME 04/30/19   Koberlein, Junell C, MD  Blood Glucose Monitoring Suppl (ACCU-CHEK AVIVA PLUS) w/Device KIT USE METER FOR UP TO FOUR TIMES A DAY 03/25/19   Koberlein, Junell C, MD  glipiZIDE (GLUCOTROL) 5 MG tablet TAKE 1/2 TABLET BY MOUTH DAILY BEFORE BREAKFAST 05/06/19    Koberlein, Junell C, MD  glucose blood (ACCU-CHEK AVIVA) test strip Check blood sugars 3-4 times daily 01/30/19   Koberlein, Andris Flurry C, MD  Insulin Glargine (LANTUS SOLOSTAR) 100 UNIT/ML Solostar Pen Inject 5-10 Units into the skin at bedtime. 05/06/19   Caren Macadam, MD  levothyroxine (SYNTHROID) 25 MCG tablet TAKE 1 TABLET BY MOUTH DAILY BEFORE BREAKFAST 03/25/19   Koberlein, Junell C, MD  lisinopril (ZESTRIL) 5 MG tablet TAKE 1/2 TABLET BY MOUTH DAILY 02/07/19   Koberlein, Junell C, MD  metFORMIN (GLUCOPHAGE-XR) 500 MG 24 hr tablet TAKE 1 TABLET BY MOUTH EVERY DAY 05/28/19   Koberlein, Steele Berg, MD  norethindrone-ethinyl estradiol (JUNEL FE,GILDESS FE,LOESTRIN FE) 1-20 MG-MCG tablet Take 1 tablet by mouth daily. 10/24/18   Caren Macadam, MD    Family History Family History  Problem Relation Age of Onset  . Heart disease Paternal Grandfather   . Diabetes Father   . Heart disease Father   . Hypertension Father   . Stroke Father 40       mini stroke  . High Cholesterol Father   . Diabetes Paternal Grandmother   . Arthritis Paternal Grandmother   . Early death Mother        lung issues; heavy smoker. Uncertain exact cause of death  . Early death Maternal Grandmother        smoking related    Social History Social History  Tobacco Use  . Smoking status: Never Smoker  . Smokeless tobacco: Never Used  Substance Use Topics  . Alcohol use: No  . Drug use: No     Allergies   Patient has no known allergies.   Review of Systems Review of Systems  Constitutional: Positive for fever. Negative for chills.  HENT: Negative for ear pain and sore throat.   Eyes: Negative for pain and visual disturbance.  Respiratory: Positive for cough and shortness of breath.   Cardiovascular: Negative for chest pain and palpitations.  Gastrointestinal: Negative for abdominal pain and vomiting.  Genitourinary: Negative for dysuria and hematuria.  Musculoskeletal: Negative for arthralgias  and back pain.  Skin: Negative for color change and rash.  Neurological: Negative for seizures and syncope.  All other systems reviewed and are negative.    Physical Exam Triage Vital Signs ED Triage Vitals  Enc Vitals Group     BP      Pulse      Resp      Temp      Temp src      SpO2      Weight      Height      Head Circumference      Peak Flow      Pain Score      Pain Loc      Pain Edu?      Excl. in [place]?    No data found.  Updated Vital Signs BP 118/67 (BP Location: Left Arm)   Pulse (!) 102   Temp (!) 100.8 F (38.2 C) (Oral)   Resp 19   SpO2 100%   Visual Acuity Right Eye Distance:   Left Eye Distance:   Bilateral Distance:    Right Eye Near:   Left Eye Near:    Bilateral Near:     Physical Exam Vitals signs and nursing note reviewed.  Constitutional:      General: She is not in acute distress.    Appearance: She is well-developed. She is not ill-appearing.  HENT:     Head: Normocephalic and atraumatic.     Right Ear: Tympanic membrane normal.     Left Ear: Tympanic membrane normal.     Nose: Nose normal.     Mouth/Throat:     Mouth: Mucous membranes are moist.     Pharynx: Oropharynx is clear.  Eyes:     Conjunctiva/sclera: Conjunctivae normal.  Neck:     Musculoskeletal: Neck supple.  Cardiovascular:     Rate and Rhythm: Normal rate and regular rhythm.     Heart sounds: No murmur.  Pulmonary:     Effort: Pulmonary effort is normal. No respiratory distress.     Breath sounds: Normal breath sounds. No wheezing or rhonchi.  Abdominal:     General: Bowel sounds are normal.     Palpations: Abdomen is soft.     Tenderness: There is no abdominal tenderness. There is no guarding or rebound.  Skin:    General: Skin is warm and dry.     Findings: No rash.  Neurological:     General: No focal deficit present.     Mental Status: She is alert and oriented to person, place, and time.      UC Treatments / Results  Labs (all labs ordered  are listed, but only abnormal results are displayed) Labs Reviewed  SARS CORONAVIRUS 2 (TAT 6-24 HRS)    EKG   Radiology No results found.  Procedures  Procedures (including critical care time)  Medications Ordered in UC Medications  acetaminophen (TYLENOL) tablet 650 mg (650 mg Oral Given 06/08/19 1215)  acetaminophen (TYLENOL) 325 MG tablet (has no administration in time range)    Initial Impression / Assessment and Plan / UC Course  I have reviewed the triage vital signs and the nursing notes.  Pertinent labs & imaging results that were available during my care of the patient were reviewed by me and considered in my medical decision making (see chart for details).    Cough, suspected COVID.  Instructed patient to continue taking Tylenol as needed for fever or discomfort.  COVID test performed here.  Instructed patient to self quarantine until the test result is back.  Instructed patient to go to the emergency department if she develops high fever, shortness of breath, severe diarrhea, or other concerning symptoms.  Patient agrees with plan of care.     Final Clinical Impressions(s) / UC Diagnoses   Final diagnoses:  Cough  Suspected COVID-19 virus infection     Discharge Instructions     Take Tylenol as needed for your fever or discomfort.    Your COVID test is pending.  You should self quarantine until your test result is back and is negative.    Go to the emergency department if you develop high fever, shortness of breath, severe diarrhea, or other concerning symptoms.       ED Prescriptions    None     PDMP not reviewed this encounter.   Sharion Balloon, NP 06/08/19 1235

## 2019-06-08 NOTE — ED Triage Notes (Signed)
Patient presents to Urgent Care with complaints of shortness of breath since yesterday. Patient reports it is the same thing she came in for two months ago, requesting covid testing.

## 2019-06-08 NOTE — Discharge Instructions (Addendum)
Take Tylenol as needed for your fever or discomfort.    Your COVID test is pending.  You should self quarantine until your test result is back and is negative.    Go to the emergency department if you develop high fever, shortness of breath, severe diarrhea, or other concerning symptoms.

## 2019-06-09 LAB — SARS CORONAVIRUS 2 (TAT 6-24 HRS): SARS Coronavirus 2: POSITIVE — AB

## 2019-06-11 ENCOUNTER — Telehealth (HOSPITAL_COMMUNITY): Payer: Self-pay | Admitting: Emergency Medicine

## 2019-06-11 NOTE — Telephone Encounter (Signed)
Positive covid, pt contacted and made aware. Pt c/o ongoing headaches and also vaginal stinging. Encouraged pt to follow up with PCP or do urgent care video visit to discuss new symptoms. Pt verbalized understanding, all questions answered.

## 2019-06-26 ENCOUNTER — Other Ambulatory Visit: Payer: Self-pay | Admitting: Family Medicine

## 2019-06-26 DIAGNOSIS — E11649 Type 2 diabetes mellitus with hypoglycemia without coma: Secondary | ICD-10-CM

## 2019-07-10 ENCOUNTER — Telehealth (INDEPENDENT_AMBULATORY_CARE_PROVIDER_SITE_OTHER): Payer: Medicare Other | Admitting: Family Medicine

## 2019-07-10 ENCOUNTER — Other Ambulatory Visit: Payer: Self-pay

## 2019-07-10 DIAGNOSIS — E11649 Type 2 diabetes mellitus with hypoglycemia without coma: Secondary | ICD-10-CM | POA: Diagnosis not present

## 2019-07-10 DIAGNOSIS — Z309 Encounter for contraceptive management, unspecified: Secondary | ICD-10-CM

## 2019-07-10 DIAGNOSIS — I10 Essential (primary) hypertension: Secondary | ICD-10-CM

## 2019-07-10 DIAGNOSIS — E039 Hypothyroidism, unspecified: Secondary | ICD-10-CM

## 2019-07-10 DIAGNOSIS — E1159 Type 2 diabetes mellitus with other circulatory complications: Secondary | ICD-10-CM

## 2019-07-10 MED ORDER — NORETHIN ACE-ETH ESTRAD-FE 1-20 MG-MCG PO TABS: 1.0000 | ORAL_TABLET | Freq: Every day | ORAL | 3 refills | Status: DC

## 2019-07-10 MED ORDER — LEVOTHYROXINE SODIUM 25 MCG PO TABS: ORAL_TABLET | ORAL | 1 refills | Status: DC

## 2019-07-10 MED ORDER — LISINOPRIL 5 MG PO TABS: 2.5000 mg | ORAL_TABLET | Freq: Every day | ORAL | 1 refills | Status: DC

## 2019-07-10 MED ORDER — GLIPIZIDE 5 MG PO TABS: ORAL_TABLET | ORAL | 1 refills | Status: DC

## 2019-07-10 NOTE — Progress Notes (Signed)
Virtual Visit via Video Note  I connected with Sharon Wagner   on 07/10/19 at  11:00 AM EST by a video enabled telemedicine application and verified that I am speaking with the correct person using two identifiers.  Location patient: home Location provider:work or home office Persons participating in the virtual visit: patient, provider  I discussed the limitations of evaluation and management by telemedicine and the availability of in person appointments. The patient expressed understanding and agreed to proceed.  Sharon Wagner DOB: 1996-03-26 Encounter date: 07/10/2019  This is a 23 y.o. female who presents with No chief complaint on file.   History of present illness: She is doing well.   In last month had to go get tested for COVID and was positive, so had to isolate for 14 weeks. Was very fatigued, hard to breathe, felt like body was shutting down.  Didn't eat well when she had it. Went back to eating after. Trying to cut back again.   Blood sugars have been good lately - when checked last week states it went up to 400. Not sure what she did to trigger this. usually taking between 7-10 units insulin. Still taking glipizide and metformin. On hindsight she did eat pecan pie.   Doing ok with birth control as well. Periods monthy and menses last a week.   Not sure what weight is. Hasn't been eating as well.   Trying to exercise in the house. Sit ups, push ups, walking around.   Has been drinking a lot of water.   Eating more at home. Sometimes grab food on trip to work but still will grab fast food occasionally (only once a month).  She is taking thyroid medication regularly.  No Known Allergies No outpatient medications have been marked as taking for the 07/10/19 encounter (Telemedicine) with Caren Macadam, MD.    Review of Systems  Constitutional: Negative for chills, fatigue and fever.  Respiratory: Negative for cough, chest tightness, shortness  of breath and wheezing.   Cardiovascular: Negative for chest pain, palpitations and leg swelling.  Genitourinary: Negative for menstrual problem.    Objective:  There were no vitals taken for this visit.      BP Readings from Last 3 Encounters:  06/08/19 118/67  06/06/19 138/76  04/22/19 127/75   Wt Readings from Last 3 Encounters:  12/17/18 241 lb (109.3 kg)  10/24/18 249 lb 1.6 oz (113 kg)  10/10/18 257 lb 12.8 oz (116.9 kg)    Physical Exam Constitutional:      Appearance: Normal appearance.  Pulmonary:     Effort: Pulmonary effort is normal.  Neurological:     Mental Status: She is alert.  Psychiatric:        Mood and Affect: Mood normal.        Behavior: Behavior normal.     Assessment/Plan  1. Uncontrolled type 2 diabetes mellitus with hypoglycemia without coma ([place]) Due for repeat bloodwork. She is still eating healthier than she was in the past, but plans to work even harder on limiting intake and making good choices.  - glipiZIDE (GLUCOTROL) 5 MG tablet; TAKE 1/2 TABLET BY MOUTH DAILY BEFORE BREAKFAST  Dispense: 45 tablet; Refill: 1 - lisinopril (ZESTRIL) 5 MG tablet; Take 0.5 tablets (2.5 mg total) by mouth daily.  Dispense: 45 tablet; Refill: 1  2. Hypothyroidism, unspecified type Recheck bloodwork. She has been taking medication daily.  - levothyroxine (SYNTHROID) 25 MCG tablet; TAKE 1 TABLET BY MOUTH DAILY BEFORE BREAKFAST  Dispense: 90 tablet; Refill: 1  3. Hypertension associated with diabetes (HCC) Will recheck at future visit. Not checking at home.   4. Morbid obesity (HCC) Uncertain current weight.   5. Encounter for contraceptive management, unspecified type Tolerating ocp well and periods are well controlled. - norethindrone-ethinyl estradiol (LOESTRIN FE) 1-20 MG-MCG tablet; Take 1 tablet by mouth daily.  Dispense: 3 Package; Refill: 3    Return for pending bloodwork.     Theodis Shove, MD

## 2019-07-11 ENCOUNTER — Telehealth: Payer: Self-pay | Admitting: *Deleted

## 2019-07-11 NOTE — Telephone Encounter (Signed)
-----   Message from Caren Macadam, MD sent at 07/10/2019 11:16 AM EST ----- Please set up lab visit for her; labs ordered from May

## 2019-07-11 NOTE — Telephone Encounter (Signed)
Left a detailed message at the pts cell number to call the office to schedule a fasting appt.  I also left a message stating to check with her insurance to see what pharmacy she should receive her medications from as well.

## 2019-08-14 ENCOUNTER — Ambulatory Visit: Payer: Medicare Other | Admitting: Family Medicine

## 2019-08-14 NOTE — Progress Notes (Signed)
No show

## 2019-08-28 ENCOUNTER — Telehealth: Payer: Self-pay | Admitting: Family Medicine

## 2019-08-28 DIAGNOSIS — E11649 Type 2 diabetes mellitus with hypoglycemia without coma: Secondary | ICD-10-CM

## 2019-08-28 MED ORDER — METFORMIN HCL ER 500 MG PO TB24: 500.0000 mg | ORAL_TABLET | Freq: Every day | ORAL | 0 refills | Status: DC

## 2019-08-28 MED ORDER — BD PEN NEEDLE SHORT U/F 31G X 8 MM MISC: 0 refills | Status: DC

## 2019-08-28 NOTE — Telephone Encounter (Signed)
Pt called in stating that she needs her pen needles [facility] [address]

## 2019-08-28 NOTE — Telephone Encounter (Signed)
Rxs sent with note stating the patient needs an appt.

## 2019-08-29 ENCOUNTER — Telehealth: Payer: Self-pay | Admitting: Family Medicine

## 2019-08-29 DIAGNOSIS — E11649 Type 2 diabetes mellitus with hypoglycemia without coma: Secondary | ICD-10-CM

## 2019-08-29 MED ORDER — LANTUS SOLOSTAR 100 UNIT/ML ~~LOC~~ SOPN: 5.0000 [IU] | PEN_INJECTOR | Freq: Every day | SUBCUTANEOUS | 0 refills | Status: DC

## 2019-08-29 NOTE — Telephone Encounter (Signed)
Pt is requesting a refill on the following medications: metFORMIN(GLUCOPHAGE-XR) 500 MG, Insulin Pen Needle(B-D ULTRAFINE III SHORT PEN, AND Insulin Glargine(LANTUS SOLOSTAR) 100 UNIT [facility] [employer]). Thanks

## 2019-08-29 NOTE — Telephone Encounter (Signed)
Rx sent for Lantus Solostar today with note attached stating the pt needs an appt (was a no-show for the last visit).  Metformin and BD pen needles were sent on 1/27.

## 2019-09-10 ENCOUNTER — Other Ambulatory Visit: Payer: Self-pay

## 2019-09-10 ENCOUNTER — Ambulatory Visit (HOSPITAL_COMMUNITY)
Admission: EM | Admit: 2019-09-10 | Discharge: 2019-09-10 | Disposition: A | Discharge status: Home / Self Care | Payer: Medicare Other | Attending: Family Medicine | Admitting: Family Medicine

## 2019-09-10 ENCOUNTER — Encounter (HOSPITAL_COMMUNITY): Payer: Self-pay

## 2019-09-10 DIAGNOSIS — M545 Low back pain, unspecified: Secondary | ICD-10-CM

## 2019-09-10 DIAGNOSIS — Z20822 Contact with and (suspected) exposure to covid-19: Secondary | ICD-10-CM | POA: Insufficient documentation

## 2019-09-10 NOTE — Discharge Instructions (Addendum)
You can continue the Tylenol or Aleve for pain Heat/ice to the area Follow up as needed for continued or worsening symptoms Covid swab done and we will call you if results are positive
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
## 2019-09-12 LAB — NOVEL CORONAVIRUS, NAA (HOSP ORDER, SEND-OUT TO REF LAB; TAT 18-24 HRS): SARS-CoV-2, NAA: NOT DETECTED

## 2019-09-17 ENCOUNTER — Other Ambulatory Visit: Payer: Self-pay | Admitting: Family Medicine

## 2019-09-17 DIAGNOSIS — E11649 Type 2 diabetes mellitus with hypoglycemia without coma: Secondary | ICD-10-CM

## 2019-09-20 ENCOUNTER — Other Ambulatory Visit: Payer: Self-pay | Admitting: Family Medicine

## 2019-09-20 DIAGNOSIS — E11649 Type 2 diabetes mellitus with hypoglycemia without coma: Secondary | ICD-10-CM

## 2019-10-18 ENCOUNTER — Other Ambulatory Visit: Payer: Self-pay | Admitting: Family Medicine

## 2019-10-18 DIAGNOSIS — E11649 Type 2 diabetes mellitus with hypoglycemia without coma: Secondary | ICD-10-CM

## 2019-11-01 ENCOUNTER — Ambulatory Visit (HOSPITAL_COMMUNITY)
Admission: EM | Admit: 2019-11-01 | Discharge: 2019-11-01 | Disposition: A | Discharge status: Home / Self Care | Payer: Medicare Other | Attending: Family Medicine | Admitting: Family Medicine

## 2019-11-01 ENCOUNTER — Encounter (HOSPITAL_COMMUNITY): Payer: Self-pay

## 2019-11-01 ENCOUNTER — Other Ambulatory Visit: Payer: Self-pay

## 2019-11-01 DIAGNOSIS — S90851A Superficial foreign body, right foot, initial encounter: Secondary | ICD-10-CM

## 2019-11-01 MED ORDER — TETANUS-DIPHTH-ACELL PERTUSSIS 5-2.5-18.5 LF-MCG/0.5 IM SUSP: 0.5000 mL | Freq: Once | INTRAMUSCULAR | Status: DC

## 2019-11-01 MED ORDER — TETANUS-DIPHTH-ACELL PERTUSSIS 5-2.5-18.5 LF-MCG/0.5 IM SUSP
INTRAMUSCULAR | Status: AC
  Filled 2019-11-01: qty 0.5

## 2019-11-01 NOTE — ED Provider Notes (Signed)
[place]    CSN: [id number] Arrival date & time: 11/01/19  1729      History   Chief Complaint Chief Complaint  Patient presents with  . Foot Injury    HPI Sharon Wagner is a 24 y.o. female.   HPI   Stepped on glass Still has FB sensation in foot Now there is a "yellow bubble" at the sight Is an insulin dep diabetic States sugars have been controlled  Past Medical History:  Diagnosis Date  . Allergy   . Diabetes mellitus without complication [facility])     Patient Active Problem List   Diagnosis Date Noted  . Hypothyroidism 04/07/2015  . Routine general medical examination at a health care facility 11/27/2014  . Type 2 diabetes mellitus without complication ([place]) 67/20/9470  . Hypertension associated with diabetes ([place]) 10/31/2013  . Morbid obesity ([place]) 10/31/2013  . Irregular menstrual cycle 10/31/2013    Past Surgical History:  Procedure Laterality Date  . ADENOIDECTOMY      OB History   No obstetric history on file.      Home Medications    Prior to Admission medications   Medication Sig Start Date End Date Taking? Authorizing Provider  Accu-Chek FastClix Lancets MISC USE UP TO FOUR TIMES A DAY AS DIRECTED 10/24/18   Koberlein, Andris Flurry C, MD  Blood Glucose Monitoring Suppl (ACCU-CHEK AVIVA PLUS) w/Device KIT USE METER FOR UP TO FOUR TIMES A DAY 03/25/19   Koberlein, Junell C, MD  glipiZIDE (GLUCOTROL) 5 MG tablet TAKE HALF TABLET BY MOUTH EVERY DAY BEFORE BREAKFAST 09/17/19   Koberlein, Andris Flurry C, MD  glucose blood (ACCU-CHEK AVIVA) test strip Check blood sugars 3-4 times daily 01/30/19   Koberlein, Andris Flurry C, MD  Insulin Glargine (LANTUS SOLOSTAR) 100 UNIT/ML Solostar Pen Inject 5-10 Units into the skin at bedtime. 08/29/19   Koberlein, Steele Berg, MD  Insulin Pen Needle (B-D ULTRAFINE III SHORT PEN) 31G X 8 MM MISC USE AS DIRECTED AT BEDTIME 08/28/19   Koberlein, Andris Flurry C, MD  levothyroxine (SYNTHROID) 25 MCG tablet TAKE 1 TABLET BY MOUTH  DAILY BEFORE BREAKFAST 07/10/19   Koberlein, Junell C, MD  lisinopril (ZESTRIL) 5 MG tablet Take 0.5 tablets (2.5 mg total) by mouth daily. 07/10/19   Koberlein, Steele Berg, MD  metFORMIN (GLUCOPHAGE-XR) 500 MG 24 hr tablet TAKE 1 TABLET BY MOUTH EVERY DAY 10/21/19   Koberlein, Steele Berg, MD  norethindrone-ethinyl estradiol (LOESTRIN FE) 1-20 MG-MCG tablet Take 1 tablet by mouth daily. 07/10/19   Caren Macadam, MD    Family History Family History  Problem Relation Age of Onset  . Heart disease Paternal Grandfather   . Diabetes Father   . Heart disease Father   . Hypertension Father   . Stroke Father 40       mini stroke  . High Cholesterol Father   . Diabetes Paternal Grandmother   . Arthritis Paternal Grandmother   . Early death Mother        lung issues; heavy smoker. Uncertain exact cause of death  . Early death Maternal Grandmother        smoking related    Social History Social History   Tobacco Use  . Smoking status: Never Smoker  . Smokeless tobacco: Never Used  Substance Use Topics  . Alcohol use: No  . Drug use: No     Allergies   Patient has no known allergies.   Review of Systems Review of Systems  Musculoskeletal: Positive for gait  problem.  Skin: Positive for wound.     Physical Exam Triage Vital Signs ED Triage Vitals [11/01/19 1750]  Enc Vitals Group     BP (!) 148/99     Pulse Rate 95     Resp 18     Temp 98.2 F (36.8 C)     Temp Source Oral     SpO2 97 %     Weight 250 lb (113.4 kg)     Height 5' 6"  (1.676 m)     Head Circumference      Peak Flow      Pain Score 9     Pain Loc      Pain Edu?      Excl. in [place]?    No data found.  Updated Vital Signs BP (!) 148/99   Pulse 95   Temp 98.2 F (36.8 C) (Oral)   Resp 18   Ht 5' 6"  (1.676 m)   Wt 113.4 kg   SpO2 97%   BMI 40.35 kg/m      Physical Exam Constitutional:      General: She is not in acute distress.    Appearance: She is well-developed. She is obese.  HENT:      Head: Normocephalic and atraumatic.  Eyes:     Conjunctiva/sclera: Conjunctivae normal.     Pupils: Pupils are equal, round, and reactive to light.  Cardiovascular:     Rate and Rhythm: Normal rate.  Pulmonary:     Effort: Pulmonary effort is normal. No respiratory distress.  Musculoskeletal:        General: Normal range of motion.     Cervical back: Normal range of motion.  Skin:    General: Skin is warm and dry.     Comments: Bottom of the right foot has a 1 cm raised pustule.  Opened with scalpel.  Glass and pus removed.   Neurological:     General: No focal deficit present.     Mental Status: She is alert.  Psychiatric:        Mood and Affect: Mood normal.        Behavior: Behavior normal.      UC Treatments / Results  Labs (all labs ordered are listed, but only abnormal results are displayed) Labs Reviewed - No data to display  EKG   Radiology No results found.  Procedures Procedures (including critical care time)  Medications Ordered in UC Medications  Tdap (BOOSTRIX) injection 0.5 mL (has no administration in time range)    Initial Impression / Assessment and Plan / UC Course  I have reviewed the triage vital signs and the nursing notes.  Pertinent labs & imaging results that were available during my care of the patient were reviewed by me and considered in my medical decision making (see chart for details).     Wound care discussed Final Clinical Impressions(s) / UC Diagnoses   Final diagnoses:  Foreign body in right foot, initial encounter     Discharge Instructions     Keep area clean Wash with soap and water 2 x a day Apply antibiotic ointment and a band aid until healed   ED Prescriptions    None     PDMP not reviewed this encounter.   Raylene Everts, MD 11/01/19 [phone]
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
## 2019-11-01 NOTE — Discharge Instructions (Signed)
Keep area clean Wash with soap and water 2 x a day Apply antibiotic ointment and a band aid until healed

## 2019-11-07 ENCOUNTER — Emergency Department (HOSPITAL_COMMUNITY)
Admission: EM | Admit: 2019-11-07 | Discharge: 2019-11-07 | Disposition: A | Discharge status: Home / Self Care | Payer: Medicare Other | Attending: Emergency Medicine | Admitting: Emergency Medicine

## 2019-11-07 ENCOUNTER — Emergency Department (HOSPITAL_COMMUNITY): Payer: Medicare Other

## 2019-11-07 ENCOUNTER — Encounter (HOSPITAL_COMMUNITY): Payer: Self-pay | Admitting: *Deleted

## 2019-11-07 DIAGNOSIS — Z79899 Other long term (current) drug therapy: Secondary | ICD-10-CM | POA: Diagnosis not present

## 2019-11-07 DIAGNOSIS — Z794 Long term (current) use of insulin: Secondary | ICD-10-CM | POA: Insufficient documentation

## 2019-11-07 DIAGNOSIS — I10 Essential (primary) hypertension: Secondary | ICD-10-CM | POA: Diagnosis not present

## 2019-11-07 DIAGNOSIS — S99922A Unspecified injury of left foot, initial encounter: Secondary | ICD-10-CM | POA: Diagnosis present

## 2019-11-07 DIAGNOSIS — E039 Hypothyroidism, unspecified: Secondary | ICD-10-CM | POA: Insufficient documentation

## 2019-11-07 DIAGNOSIS — M7989 Other specified soft tissue disorders: Secondary | ICD-10-CM | POA: Diagnosis not present

## 2019-11-07 DIAGNOSIS — Y999 Unspecified external cause status: Secondary | ICD-10-CM | POA: Diagnosis not present

## 2019-11-07 DIAGNOSIS — W208XXA Other cause of strike by thrown, projected or falling object, initial encounter: Secondary | ICD-10-CM | POA: Insufficient documentation

## 2019-11-07 DIAGNOSIS — Y939 Activity, unspecified: Secondary | ICD-10-CM | POA: Diagnosis not present

## 2019-11-07 DIAGNOSIS — S9782XA Crushing injury of left foot, initial encounter: Secondary | ICD-10-CM | POA: Diagnosis not present

## 2019-11-07 DIAGNOSIS — Y929 Unspecified place or not applicable: Secondary | ICD-10-CM | POA: Insufficient documentation

## 2019-11-07 DIAGNOSIS — E119 Type 2 diabetes mellitus without complications: Secondary | ICD-10-CM | POA: Diagnosis not present

## 2019-11-07 DIAGNOSIS — S9032XA Contusion of left foot, initial encounter: Secondary | ICD-10-CM | POA: Insufficient documentation

## 2019-11-07 LAB — CBG MONITORING, ED: Glucose-Capillary: 335 mg/dL — ABNORMAL HIGH (ref 70–99)

## 2019-11-07 MED ORDER — IBUPROFEN 800 MG PO TABS: 800.0000 mg | ORAL_TABLET | Freq: Three times a day (TID) | ORAL | 0 refills | Status: AC | PRN

## 2019-11-07 NOTE — ED Triage Notes (Signed)
To ED for eval of right foot pain and swelling since dropping a scale on her left foot today. Pt able to walk on foot without pain - pain with trying to bend foot or toes. Pt with + sensation and cap refill in left toes < 3 sec. No further injury

## 2019-11-07 NOTE — ED Notes (Signed)
Pt called out asking for her blood sugar to be checked. States she is diabetic and didn't take her medication this morning. PA made aware. Pt advised to take home medications when she gets home.

## 2019-11-07 NOTE — Discharge Instructions (Addendum)
Return here as needed.  Ice and elevate your foot.

## 2019-11-07 NOTE — ED Provider Notes (Signed)
[place] EMERGENCY DEPARTMENT Provider Note   CSN: [id number] Arrival date & time: 11/07/19  1504     History Chief Complaint  Patient presents with  . Foot Pain    Sharon Wagner is a 24 y.o. female.  HPI Patient presents to the emergency department with injury to the left foot.  The patient states she dropped a scale from her bathroom on her foot.  She states she is moving and that is why she dropped it because she was rushing to get it loaded.  The patient states that she has swelling over the top of her foot.  The patient states he is able to walk with there is some pain with the pressure.  Patient states she did not take any medications prior to arrival for symptoms.  Patient states that certain movements palpation make the pain worse.  Patient denies any other injuries.    Past Medical History:  Diagnosis Date  . Allergy   . Diabetes mellitus without complication [facility])     Patient Active Problem List   Diagnosis Date Noted  . Hypothyroidism 04/07/2015  . Routine general medical examination at a health care facility 11/27/2014  . Type 2 diabetes mellitus without complication ([place]) 84/69/6295  . Hypertension associated with diabetes ([place]) 10/31/2013  . Morbid obesity ([place]) 10/31/2013  . Irregular menstrual cycle 10/31/2013    Past Surgical History:  Procedure Laterality Date  . ADENOIDECTOMY       OB History   No obstetric history on file.     Family History  Problem Relation Age of Onset  . Heart disease Paternal Grandfather   . Diabetes Father   . Heart disease Father   . Hypertension Father   . Stroke Father 40       mini stroke  . High Cholesterol Father   . Diabetes Paternal Grandmother   . Arthritis Paternal Grandmother   . Early death Mother        lung issues; heavy smoker. Uncertain exact cause of death  . Early death Maternal Grandmother        smoking related    Social History   Tobacco Use  . Smoking  status: Never Smoker  . Smokeless tobacco: Never Used  Substance Use Topics  . Alcohol use: No  . Drug use: No    Home Medications Prior to Admission medications   Medication Sig Start Date End Date Taking? Authorizing Provider  Accu-Chek FastClix Lancets MISC USE UP TO FOUR TIMES A DAY AS DIRECTED 10/24/18   Koberlein, Andris Flurry C, MD  Blood Glucose Monitoring Suppl (ACCU-CHEK AVIVA PLUS) w/Device KIT USE METER FOR UP TO FOUR TIMES A DAY 03/25/19   Koberlein, Junell C, MD  glipiZIDE (GLUCOTROL) 5 MG tablet TAKE HALF TABLET BY MOUTH EVERY DAY BEFORE BREAKFAST Patient taking differently: Take 2.5 mg by mouth daily before breakfast.  09/17/19   Koberlein, Andris Flurry C, MD  glucose blood (ACCU-CHEK AVIVA) test strip Check blood sugars 3-4 times daily 01/30/19   Koberlein, Andris Flurry C, MD  ibuprofen (ADVIL) 800 MG tablet Take 1 tablet (800 mg total) by mouth every 8 (eight) hours as needed. 11/07/19   Khari Lett, Harrell Gave, PA-C  Insulin Glargine (LANTUS SOLOSTAR) 100 UNIT/ML Solostar Pen Inject 5-10 Units into the skin at bedtime. 08/29/19   Koberlein, Steele Berg, MD  Insulin Pen Needle (B-D ULTRAFINE III SHORT PEN) 31G X 8 MM MISC USE AS DIRECTED AT BEDTIME 08/28/19   Caren Macadam, MD  levothyroxine (  SYNTHROID) 25 MCG tablet TAKE 1 TABLET BY MOUTH DAILY BEFORE BREAKFAST Patient taking differently: Take 25 mcg by mouth daily before breakfast. TAKE 1 TABLET BY MOUTH DAILY BEFORE BREAKFAST 07/10/19   Koberlein, Junell C, MD  lisinopril (ZESTRIL) 5 MG tablet Take 0.5 tablets (2.5 mg total) by mouth daily. 07/10/19   Koberlein, Steele Berg, MD  metFORMIN (GLUCOPHAGE-XR) 500 MG 24 hr tablet TAKE 1 TABLET BY MOUTH EVERY DAY Patient taking differently: Take 500 mg by mouth daily.  10/21/19   Caren Macadam, MD  norethindrone-ethinyl estradiol (LOESTRIN FE) 1-20 MG-MCG tablet Take 1 tablet by mouth daily. 07/10/19   Caren Macadam, MD    Allergies    Patient has no known allergies.  Review of Systems   Review  of Systems All other systems negative except as documented in the HPI. All pertinent positives and negatives as reviewed in the HPI. Physical Exam Updated Vital Signs BP (!) 171/98   Pulse 88   Temp 99.3 F (37.4 C) (Oral)   Resp 18   Ht 5' 6"  (1.676 m)   Wt 113.4 kg   LMP 11/04/2019 (Exact Date)   SpO2 100%   BMI 40.35 kg/m   Physical Exam Vitals and nursing note reviewed.  Constitutional:      General: She is not in acute distress.    Appearance: She is well-developed.  HENT:     Head: Normocephalic and atraumatic.  Eyes:     Pupils: Pupils are equal, round, and reactive to light.  Pulmonary:     Effort: Pulmonary effort is normal.  Musculoskeletal:       Feet:  Skin:    General: Skin is warm and dry.  Neurological:     Mental Status: She is alert and oriented to person, place, and time.     ED Results / Procedures / Treatments   Labs (all labs ordered are listed, but only abnormal results are displayed) Labs Reviewed  CBG MONITORING, ED - Abnormal; Notable for the following components:      Result Value   Glucose-Capillary 335 (*)    All other components within normal limits    EKG None  Radiology DG Foot Complete Left  Result Date: 11/07/2019 CLINICAL DATA:  Crush injury. EXAM: LEFT FOOT - COMPLETE 3+ VIEW COMPARISON:  None. FINDINGS: Dorsal soft tissue swelling evident. No acute fracture. No subluxation or dislocation. No radiopaque soft tissue foreign body. IMPRESSION: Dorsal soft tissue swelling without evidence of an acute fracture. Electronically Signed   By: Misty Stanley M.D.   On: 11/07/2019 16:22    Procedures Procedures (including critical care time)  Medications Ordered in ED Medications - No data to display  ED Course  I have reviewed the triage vital signs and the nursing notes.  Pertinent labs & imaging results that were available during my care of the patient were reviewed by me and considered in my medical decision making (see chart  for details).    MDM Rules/Calculators/A&P                      Patient be treated for contusion of the foot.  Have advised to ice and elevate the foot.  I told her to use Tylenol Motrin for any pain.  Patient agrees the plan and all questions were answered.  I have advised her to follow-up with her primary doctor if not improving. Final Clinical Impression(s) / ED Diagnoses Final diagnoses:  Contusion of left foot, initial  encounter    Rx / DC Orders ED Discharge Orders         Ordered    ibuprofen (ADVIL) 800 MG tablet  Every 8 hours PRN     11/07/19 1747           Dalia Heading, PA-C 11/07/19 2149    Drenda Freeze, MD 11/07/19 2325

## 2019-11-07 NOTE — ED Notes (Signed)
Patient Alert and oriented to baseline. Stable and ambulatory to baseline. Patient verbalized understanding of the discharge instructions.  Patient belongings were taken by the patient.   

## 2019-11-08 ENCOUNTER — Other Ambulatory Visit: Payer: Self-pay | Admitting: Family Medicine

## 2019-11-08 DIAGNOSIS — E11649 Type 2 diabetes mellitus with hypoglycemia without coma: Secondary | ICD-10-CM

## 2019-11-11 ENCOUNTER — Telehealth: Payer: Self-pay | Admitting: Family Medicine

## 2019-11-11 DIAGNOSIS — E039 Hypothyroidism, unspecified: Secondary | ICD-10-CM

## 2019-11-11 DIAGNOSIS — E11649 Type 2 diabetes mellitus with hypoglycemia without coma: Secondary | ICD-10-CM

## 2019-11-11 DIAGNOSIS — Z309 Encounter for contraceptive management, unspecified: Secondary | ICD-10-CM

## 2019-11-11 MED ORDER — NORETHIN ACE-ETH ESTRAD-FE 1-20 MG-MCG PO TABS: 1.0000 | ORAL_TABLET | Freq: Every day | ORAL | 0 refills | Status: AC

## 2019-11-11 MED ORDER — GLIPIZIDE 5 MG PO TABS: ORAL_TABLET | ORAL | 0 refills | Status: DC

## 2019-11-11 MED ORDER — LANTUS SOLOSTAR 100 UNIT/ML ~~LOC~~ SOPN: 5.0000 [IU] | PEN_INJECTOR | Freq: Every day | SUBCUTANEOUS | 0 refills | Status: DC

## 2019-11-11 MED ORDER — LISINOPRIL 5 MG PO TABS: 2.5000 mg | ORAL_TABLET | Freq: Every day | ORAL | 0 refills | Status: DC

## 2019-11-11 MED ORDER — METFORMIN HCL ER 500 MG PO TB24: 500.0000 mg | ORAL_TABLET | Freq: Every day | ORAL | 0 refills | Status: DC

## 2019-11-11 MED ORDER — LEVOTHYROXINE SODIUM 25 MCG PO TABS: ORAL_TABLET | ORAL | 0 refills | Status: DC

## 2019-11-11 MED ORDER — BD PEN NEEDLE SHORT U/F 31G X 8 MM MISC: 0 refills | Status: AC

## 2019-11-11 MED ORDER — BLOOD GLUCOSE METER KIT: PACK | 0 refills | Status: DC

## 2019-11-11 NOTE — Telephone Encounter (Signed)
Rxs sent electronically and Rx for the glucometer kit and supplies [facility] at [phone].

## 2019-11-11 NOTE — Telephone Encounter (Signed)
Patient needs all of her medications called in today. She has been out of her medications for a while now. She also needs a new Blood Glucose Monitor.   glipiZIDE (GLUCOTROL) 5 MG tablet LANTUS SOLOSTAR 100 UNIT/ML Solostar Pen levothyroxine (SYNTHROID) 25 MCG tablet lisinopril (ZESTRIL) 5 MG tablet metFORMIN (GLUCOPHAGE-XR) 500 MG 24 hr tablet norethindrone-ethinyl estradiol (LOESTRIN FE) 1-20 MG-MCG tablet    [id number] [place] [place], [place] - [address] AT [place] [address] Phone:  [phone]  Fax:  [phone]

## 2019-11-11 NOTE — Addendum Note (Signed)
Addended by: Johnella Moloney on: 11/11/2019 12:26 PM   Modules accepted: Orders

## 2019-11-11 NOTE — Addendum Note (Signed)
Addended by: Johnella Moloney on: 11/11/2019 04:40 PM   Modules accepted: Orders

## 2019-11-11 NOTE — Telephone Encounter (Signed)
B-D ULTRAFINE III SHORT PEN 31G X 8 MM MISC   Patient needs this Rx sent to the pharmacy also.  She moved and lost all of her medication and medical supplies.  [id number] [place], [place] - [address] Phone:  [phone]  Fax:  [phone]

## 2019-11-11 NOTE — Telephone Encounter (Signed)
Rx done. 

## 2019-11-14 ENCOUNTER — Telehealth: Payer: Self-pay | Admitting: Family Medicine

## 2019-11-14 NOTE — Telephone Encounter (Signed)
Pt is requesting a refill Lantus Solostar 100 unit. Pt is also, requesting a kit to check her sugar because she does not have one. [id number]. Thanks

## 2019-11-14 NOTE — Telephone Encounter (Signed)
Spoke with the pt and informed her a refill on Lantus and an Rx for a glucometer kit and supplies were sent to the pharmacy on 4/12. Also advised the pt to make sure to keep the appt on 5/7 and she agreed.

## 2019-11-15 NOTE — Telephone Encounter (Signed)
Pt called her pharmacy and told her there is nothing on file for a refill on Metformin, glucose meter, Lantus. Pt is upset and feels she has been getting the run-around with them and would like the CMA to find out what is going on and to call her back today.   Medication Refill: Metformin Lantus Glucose Meter  [facility]  [address] [facility] FAX: [phone]    Pt can be reached at [phone]

## 2019-11-18 MED ORDER — BLOOD GLUCOSE METER KIT: PACK | 0 refills | Status: AC

## 2019-11-18 NOTE — Telephone Encounter (Signed)
I called [facility] and spoke with Greig Castilla, the pharmacist.  Greig Castilla stated the Rx requests were received and due to the pts insurance being Medicare, specific instructions are needed for the glucometer.  Greig Castilla stated the Rx for lancets are not due until 4/21 and Metformin is not due until 4/24.  New Rx was faxed for the glucometer with sigs and I attempted to contact the pt and a message was received stating the number is not in service.

## 2019-11-18 NOTE — Addendum Note (Signed)
Addended by: Johnella Moloney on: 11/18/2019 09:39 AM   Modules accepted: Orders

## 2019-12-02 ENCOUNTER — Other Ambulatory Visit: Payer: Self-pay | Admitting: Family Medicine

## 2019-12-02 DIAGNOSIS — I10 Essential (primary) hypertension: Secondary | ICD-10-CM

## 2019-12-02 DIAGNOSIS — Z13220 Encounter for screening for lipoid disorders: Secondary | ICD-10-CM

## 2019-12-02 DIAGNOSIS — Z794 Long term (current) use of insulin: Secondary | ICD-10-CM

## 2019-12-02 DIAGNOSIS — E1159 Type 2 diabetes mellitus with other circulatory complications: Secondary | ICD-10-CM

## 2019-12-02 DIAGNOSIS — E039 Hypothyroidism, unspecified: Secondary | ICD-10-CM

## 2019-12-02 DIAGNOSIS — E11649 Type 2 diabetes mellitus with hypoglycemia without coma: Secondary | ICD-10-CM

## 2019-12-02 DIAGNOSIS — E1165 Type 2 diabetes mellitus with hyperglycemia: Secondary | ICD-10-CM

## 2019-12-02 MED ORDER — LANTUS SOLOSTAR 100 UNIT/ML ~~LOC~~ SOPN: 5.0000 [IU] | PEN_INJECTOR | Freq: Every day | SUBCUTANEOUS | 0 refills | Status: DC

## 2019-12-02 NOTE — Telephone Encounter (Signed)
Pt called in advising that the pharmacy said she needed to contact her pcp office about her medication. Informed pt of previous message and that she would want to touch base with the pharmacy and or insurance to see what they are requiring.

## 2019-12-02 NOTE — Telephone Encounter (Signed)
Spoke with Freida Busman the pharmacist and he stated he did not advise the pt to contact our office as the pt received 5 pens for the Rx for insulin mid-January and this should last 5 months regardless of the dose and its too soon for a refill.  Unable to reach the pt as message states the number is not in service.

## 2019-12-06 ENCOUNTER — Encounter: Payer: Medicare Other | Admitting: Family Medicine

## 2019-12-06 NOTE — Progress Notes (Deleted)
Sharon Wagner DOB: 20-Apr-1996 Encounter date: 12/06/2019  This is a 23 y.o. female who presents for complete physical   History of present illness/Additional concerns: DMII:last A1C was 11/2018. Patient has no showed/cancelled follow up appointments/blood work appointments.   Last pap was 09/2018: normal ***  Past Medical History:  Diagnosis Date  . Allergy   . Diabetes mellitus without complication [facility])    Past Surgical History:  Procedure Laterality Date  . ADENOIDECTOMY     No Known Allergies No outpatient medications have been marked as taking for the 12/06/19 encounter (Appointment) with Wynn Banker, MD.   Social History   Tobacco Use  . Smoking status: Never Smoker  . Smokeless tobacco: Never Used  Substance Use Topics  . Alcohol use: No   Family History  Problem Relation Age of Onset  . Heart disease Paternal Grandfather   . Diabetes Father   . Heart disease Father   . Hypertension Father   . Stroke Father 40       mini stroke  . High Cholesterol Father   . Diabetes Paternal Grandmother   . Arthritis Paternal Grandmother   . Early death Mother        lung issues; heavy smoker. Uncertain exact cause of death  . Early death Maternal Grandmother        smoking related     Review of Systems  CBC:  Lab Results  Component Value Date   WBC 10.6 (H) 09/22/2018   HGB 14.2 09/22/2018   HCT 42.4 09/22/2018   MCH 22.8 (L) 09/22/2018   MCHC 33.5 09/22/2018   RDW 14.2 09/22/2018   PLT 406 (H) 09/22/2018   CMP: Lab Results  Component Value Date   NA 138 10/10/2018   K 4.2 10/10/2018   CL 104 10/10/2018   CO2 27 10/10/2018   ANIONGAP 9 09/22/2018   GLUCOSE 78 10/10/2018   BUN 9 10/10/2018   CREATININE 0.65 10/10/2018   CREATININE 0.57 10/31/2013   GFRAA >60 09/22/2018   GFRAA >89 10/31/2013   CALCIUM 9.6 10/10/2018   PROT 7.4 10/10/2018   BILITOT 0.4 10/10/2018   ALKPHOS 70 10/10/2018   ALT 25 10/10/2018   AST 26 10/10/2018    LIPID: Lab Results  Component Value Date   CHOL 139 10/10/2018   TRIG 168.0 (H) 10/10/2018   HDL 32.10 (L) 10/10/2018   LDLCALC 73 10/10/2018    Objective:  There were no vitals taken for this visit.      BP Readings from Last 3 Encounters:  11/07/19 (!) 171/98  11/01/19 (!) 148/99  09/10/19 123/82   Wt Readings from Last 3 Encounters:  11/07/19 250 lb (113.4 kg)  11/01/19 250 lb (113.4 kg)  12/17/18 241 lb (109.3 kg)    Physical Exam  Assessment/Plan: Health Maintenance Due  Topic Date Due  . PNEUMOCOCCAL POLYSACCHARIDE VACCINE AGE 31-64 HIGH RISK  Never done  . OPHTHALMOLOGY EXAM  Never done  . HIV Screening  Never done  . COVID-19 Vaccine (1) Never done  . TETANUS/TDAP  03/14/2016  . HEMOGLOBIN A1C  06/23/2019  . FOOT EXAM  09/27/2019   Health Maintenance reviewed - {health maintenance:315237}.  There are no diagnoses linked to this encounter.  No follow-ups on file.  Theodis Shove, MD

## 2019-12-10 ENCOUNTER — Other Ambulatory Visit: Payer: Self-pay | Admitting: Family Medicine

## 2019-12-10 DIAGNOSIS — E11649 Type 2 diabetes mellitus with hypoglycemia without coma: Secondary | ICD-10-CM

## 2019-12-11 ENCOUNTER — Other Ambulatory Visit: Payer: Self-pay | Admitting: Family Medicine

## 2019-12-11 DIAGNOSIS — E11649 Type 2 diabetes mellitus with hypoglycemia without coma: Secondary | ICD-10-CM

## 2020-02-10 ENCOUNTER — Other Ambulatory Visit: Payer: Self-pay | Admitting: Family Medicine

## 2020-02-10 DIAGNOSIS — E11649 Type 2 diabetes mellitus with hypoglycemia without coma: Secondary | ICD-10-CM

## 2020-02-10 DIAGNOSIS — E039 Hypothyroidism, unspecified: Secondary | ICD-10-CM

## 2020-02-11 MED ORDER — METFORMIN HCL ER 500 MG PO TB24: 500.0000 mg | ORAL_TABLET | Freq: Every day | ORAL | 0 refills | Status: DC

## 2020-02-11 MED ORDER — LEVOTHYROXINE SODIUM 25 MCG PO TABS: ORAL_TABLET | ORAL | 0 refills | Status: AC

## 2020-02-11 MED ORDER — LISINOPRIL 5 MG PO TABS: 2.5000 mg | ORAL_TABLET | Freq: Every day | ORAL | 0 refills | Status: DC

## 2020-02-11 MED ORDER — LANTUS SOLOSTAR 100 UNIT/ML ~~LOC~~ SOPN: 5.0000 [IU] | PEN_INJECTOR | Freq: Every day | SUBCUTANEOUS | 0 refills | Status: DC

## 2020-02-13 ENCOUNTER — Other Ambulatory Visit: Payer: Self-pay | Admitting: Family Medicine

## 2020-02-13 DIAGNOSIS — E11649 Type 2 diabetes mellitus with hypoglycemia without coma: Secondary | ICD-10-CM

## 2020-02-20 ENCOUNTER — Telehealth: Payer: Self-pay | Admitting: Family Medicine

## 2020-02-20 DIAGNOSIS — E11649 Type 2 diabetes mellitus with hypoglycemia without coma: Secondary | ICD-10-CM

## 2020-02-20 MED ORDER — LANTUS SOLOSTAR 100 UNIT/ML ~~LOC~~ SOPN
5.0000 [IU] | PEN_INJECTOR | Freq: Every day | SUBCUTANEOUS | 0 refills | Status: DC
Start: 2020-02-20 — End: 2020-02-25

## 2020-02-20 NOTE — Telephone Encounter (Signed)
Pt is calling in to check the status of the Rx b/c she is really not feeling good and she is aware that it has been sent in and she needs to contact the pharmacy.  She stated that she will contact them and call us back.

## 2020-02-20 NOTE — Telephone Encounter (Signed)
Rx done. 

## 2020-02-20 NOTE — Telephone Encounter (Signed)
Pt is calling in stating that she is out of her insulin (LANTUS SOLOSTAR) and is out of town and would like to see if it can be sent to  Pharm[facility] [address] in [place], [place] [address] [phone].  Pt would like to have a call [phone] back when sent to the pharmacy.

## 2020-02-21 ENCOUNTER — Other Ambulatory Visit: Payer: Self-pay | Admitting: Family Medicine

## 2020-02-21 DIAGNOSIS — E11649 Type 2 diabetes mellitus with hypoglycemia without coma: Secondary | ICD-10-CM

## 2020-02-25 ENCOUNTER — Other Ambulatory Visit: Payer: Self-pay | Admitting: Family Medicine

## 2020-02-25 DIAGNOSIS — E11649 Type 2 diabetes mellitus with hypoglycemia without coma: Secondary | ICD-10-CM

## 2020-04-03 ENCOUNTER — Encounter: Payer: Medicare HMO | Admitting: Family Medicine

## 2020-04-03 NOTE — Progress Notes (Deleted)
Justine Null DOB: 12/12/1995 Encounter date: 04/03/2020  This is a 24 y.o. female who presents for complete physical   History of present illness/Additional concerns:  ***  Past Medical History:  Diagnosis Date  . Allergy   . Diabetes mellitus without complication [facility])    Past Surgical History:  Procedure Laterality Date  . ADENOIDECTOMY     No Known Allergies No outpatient medications have been marked as taking for the 04/03/20 encounter (Appointment) with Wynn Banker, MD.   Social History   Tobacco Use  . Smoking status: Never Smoker  . Smokeless tobacco: Never Used  Substance Use Topics  . Alcohol use: No   Family History  Problem Relation Age of Onset  . Heart disease Paternal Grandfather   . Diabetes Father   . Heart disease Father   . Hypertension Father   . Stroke Father 40       mini stroke  . High Cholesterol Father   . Diabetes Paternal Grandmother   . Arthritis Paternal Grandmother   . Early death Mother        lung issues; heavy smoker. Uncertain exact cause of death  . Early death Maternal Grandmother        smoking related     Review of Systems  CBC:  Lab Results  Component Value Date   WBC 10.6 (H) 09/22/2018   HGB 14.2 09/22/2018   HCT 42.4 09/22/2018   MCH 22.8 (L) 09/22/2018   MCHC 33.5 09/22/2018   RDW 14.2 09/22/2018   PLT 406 (H) 09/22/2018   CMP: Lab Results  Component Value Date   NA 138 10/10/2018   K 4.2 10/10/2018   CL 104 10/10/2018   CO2 27 10/10/2018   ANIONGAP 9 09/22/2018   GLUCOSE 78 10/10/2018   BUN 9 10/10/2018   CREATININE 0.65 10/10/2018   CREATININE 0.57 10/31/2013   GFRAA >60 09/22/2018   GFRAA >89 10/31/2013   CALCIUM 9.6 10/10/2018   PROT 7.4 10/10/2018   BILITOT 0.4 10/10/2018   ALKPHOS 70 10/10/2018   ALT 25 10/10/2018   AST 26 10/10/2018   LIPID: Lab Results  Component Value Date   CHOL 139 10/10/2018   TRIG 168.0 (H) 10/10/2018   HDL 32.10 (L) 10/10/2018   LDLCALC 73  10/10/2018    Objective:  There were no vitals taken for this visit.      BP Readings from Last 3 Encounters:  11/07/19 (!) 171/98  11/01/19 (!) 148/99  09/10/19 123/82   Wt Readings from Last 3 Encounters:  11/07/19 250 lb (113.4 kg)  11/01/19 250 lb (113.4 kg)  12/17/18 241 lb (109.3 kg)    Physical Exam  Assessment/Plan: Health Maintenance Due  Topic Date Due  . Hepatitis C Screening  Never done  . PNEUMOCOCCAL POLYSACCHARIDE VACCINE AGE 11-64 HIGH RISK  Never done  . OPHTHALMOLOGY EXAM  Never done  . COVID-19 Vaccine (1) Never done  . HIV Screening  Never done  . TETANUS/TDAP  03/14/2016  . HEMOGLOBIN A1C  06/23/2019  . FOOT EXAM  09/27/2019  . INFLUENZA VACCINE  03/01/2020   Health Maintenance reviewed - {health maintenance:315237}.  There are no diagnoses linked to this encounter.  No follow-ups on file.  Theodis Shove, MD

## 2020-04-07 ENCOUNTER — Encounter: Payer: Self-pay | Admitting: *Deleted

## 2020-04-08 ENCOUNTER — Telehealth: Payer: Self-pay | Admitting: *Deleted

## 2020-04-08 NOTE — Telephone Encounter (Signed)
-----   Message from Wynn Banker, MD sent at 04/05/2020  8:14 AM EDT ----- Not sure who to forward this to:  I don't think we have been able to reach patient by phone. I don't want to have to dismiss for no shows, but she has had 3 in last year. I haven't seen her in the office for over a year, and no bloodwork in over a year. She is an insulin dependent diabetic and I cannot properly care for her without labwork and regular visits. I think we can give one more try. Perhaps send letter and have her complete labs that are already ordered with an in person follow up. If she misses again, I will have to dismiss her as I just feel I am not providing good care.   No shows: 04/03/20, 08/14/19, 04/20/19.

## 2020-04-08 NOTE — Telephone Encounter (Signed)
Letter approved by PCP and mailed to the pts home address.

## 2020-04-12 ENCOUNTER — Other Ambulatory Visit: Payer: Self-pay | Admitting: Family Medicine

## 2020-04-12 DIAGNOSIS — E11649 Type 2 diabetes mellitus with hypoglycemia without coma: Secondary | ICD-10-CM

## 2020-04-22 ENCOUNTER — Telehealth: Payer: Self-pay | Admitting: Family Medicine

## 2020-04-23 NOTE — Progress Notes (Signed)
Letter mailed on 9/8.

## 2020-05-14 ENCOUNTER — Other Ambulatory Visit: Payer: Self-pay | Admitting: Family Medicine

## 2020-05-14 DIAGNOSIS — E11649 Type 2 diabetes mellitus with hypoglycemia without coma: Secondary | ICD-10-CM

## 2020-05-15 ENCOUNTER — Other Ambulatory Visit: Payer: Self-pay | Admitting: Family Medicine

## 2020-05-15 ENCOUNTER — Telehealth: Payer: Self-pay | Admitting: Family Medicine

## 2020-05-15 DIAGNOSIS — E11649 Type 2 diabetes mellitus with hypoglycemia without coma: Secondary | ICD-10-CM

## 2020-05-15 NOTE — Telephone Encounter (Signed)
I don't know providers out there; if she is having symptoms now, she needs to be evaluated. She could call insurance to find active provider that can see her out there. She is due for evaluation and bloodwork, so important that she does this.

## 2020-05-15 NOTE — Telephone Encounter (Signed)
Pt needs to talk with the Dr. or nurse; she had some problems with her Blood sugar level yesterday. She said she experienced some shaking, headache, and dizziness. The patient said she no longer lives in [place] and wants to know if Dr. Hassan Rowan can recommend a primary care physician in [place], where she now resides. Would you please call [phone] pt contact phone number

## 2020-05-15 NOTE — Telephone Encounter (Signed)
Left a message for the pt to return my call.  

## 2020-05-26 NOTE — Telephone Encounter (Signed)
No answer at the number below or the pts home number.

## 2020-05-28 ENCOUNTER — Other Ambulatory Visit: Payer: Self-pay | Admitting: Family Medicine

## 2020-05-28 DIAGNOSIS — E11649 Type 2 diabetes mellitus with hypoglycemia without coma: Secondary | ICD-10-CM

## 2020-06-03 NOTE — Telephone Encounter (Signed)
error 

## 2020-06-10 ENCOUNTER — Other Ambulatory Visit: Payer: Self-pay | Admitting: Family Medicine

## 2020-06-10 DIAGNOSIS — E11649 Type 2 diabetes mellitus with hypoglycemia without coma: Secondary | ICD-10-CM

## 2020-06-13 ENCOUNTER — Other Ambulatory Visit: Payer: Self-pay | Admitting: Family Medicine

## 2020-06-13 DIAGNOSIS — E11649 Type 2 diabetes mellitus with hypoglycemia without coma: Secondary | ICD-10-CM

## 2020-08-12 ENCOUNTER — Encounter: Payer: Self-pay | Admitting: Family Medicine

## 2020-09-03 ENCOUNTER — Other Ambulatory Visit: Payer: Self-pay | Admitting: Family Medicine

## 2020-09-03 DIAGNOSIS — E11649 Type 2 diabetes mellitus with hypoglycemia without coma: Secondary | ICD-10-CM

## 2020-09-10 IMAGING — DX DG FOOT COMPLETE 3+V*L*
3 series · 3 of 3 positions shown · non-contrast
Comparison: None.

CLINICAL DATA: Crush injury.

EXAM:
LEFT FOOT - COMPLETE 3+ VIEW

[foot ap]
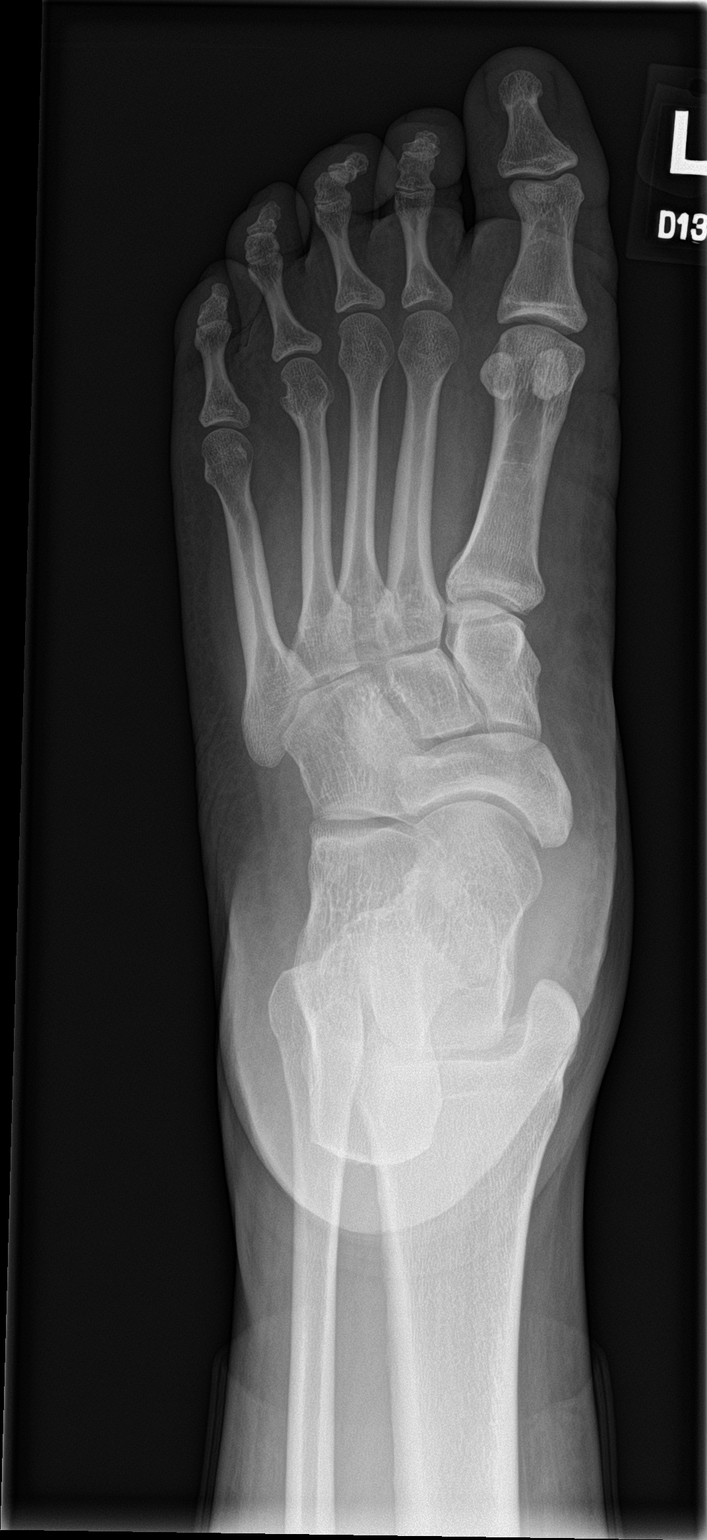

[foot obl]
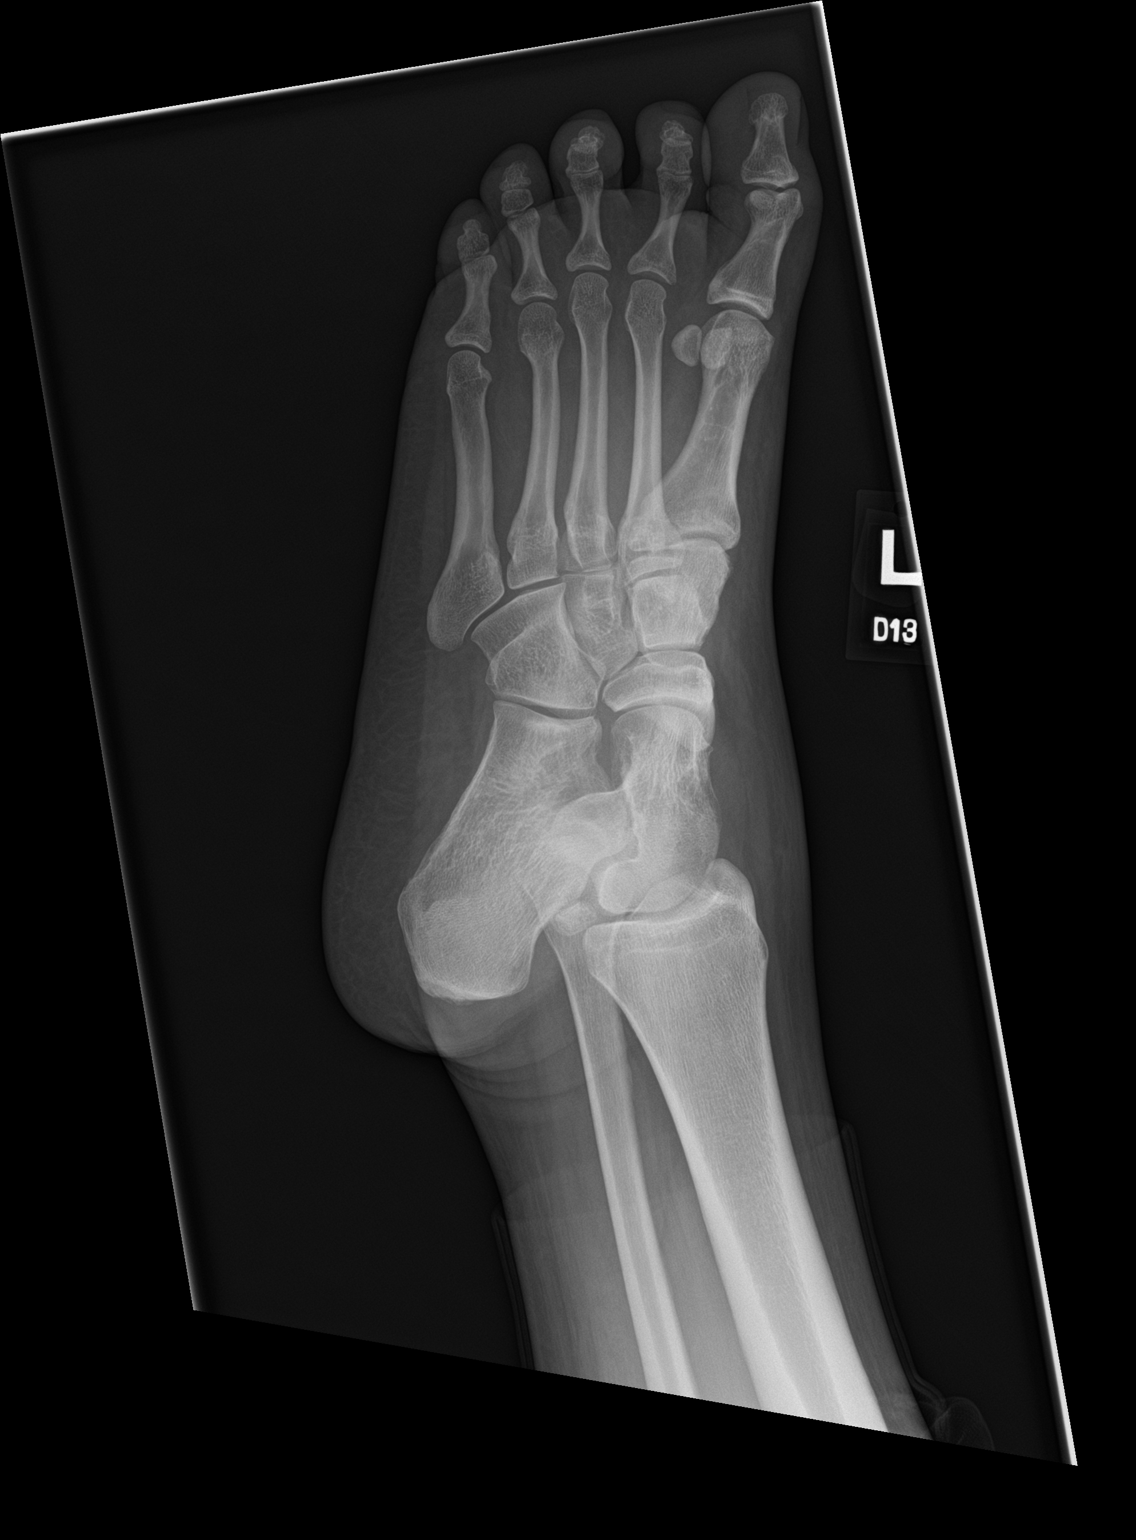

[foot lat]
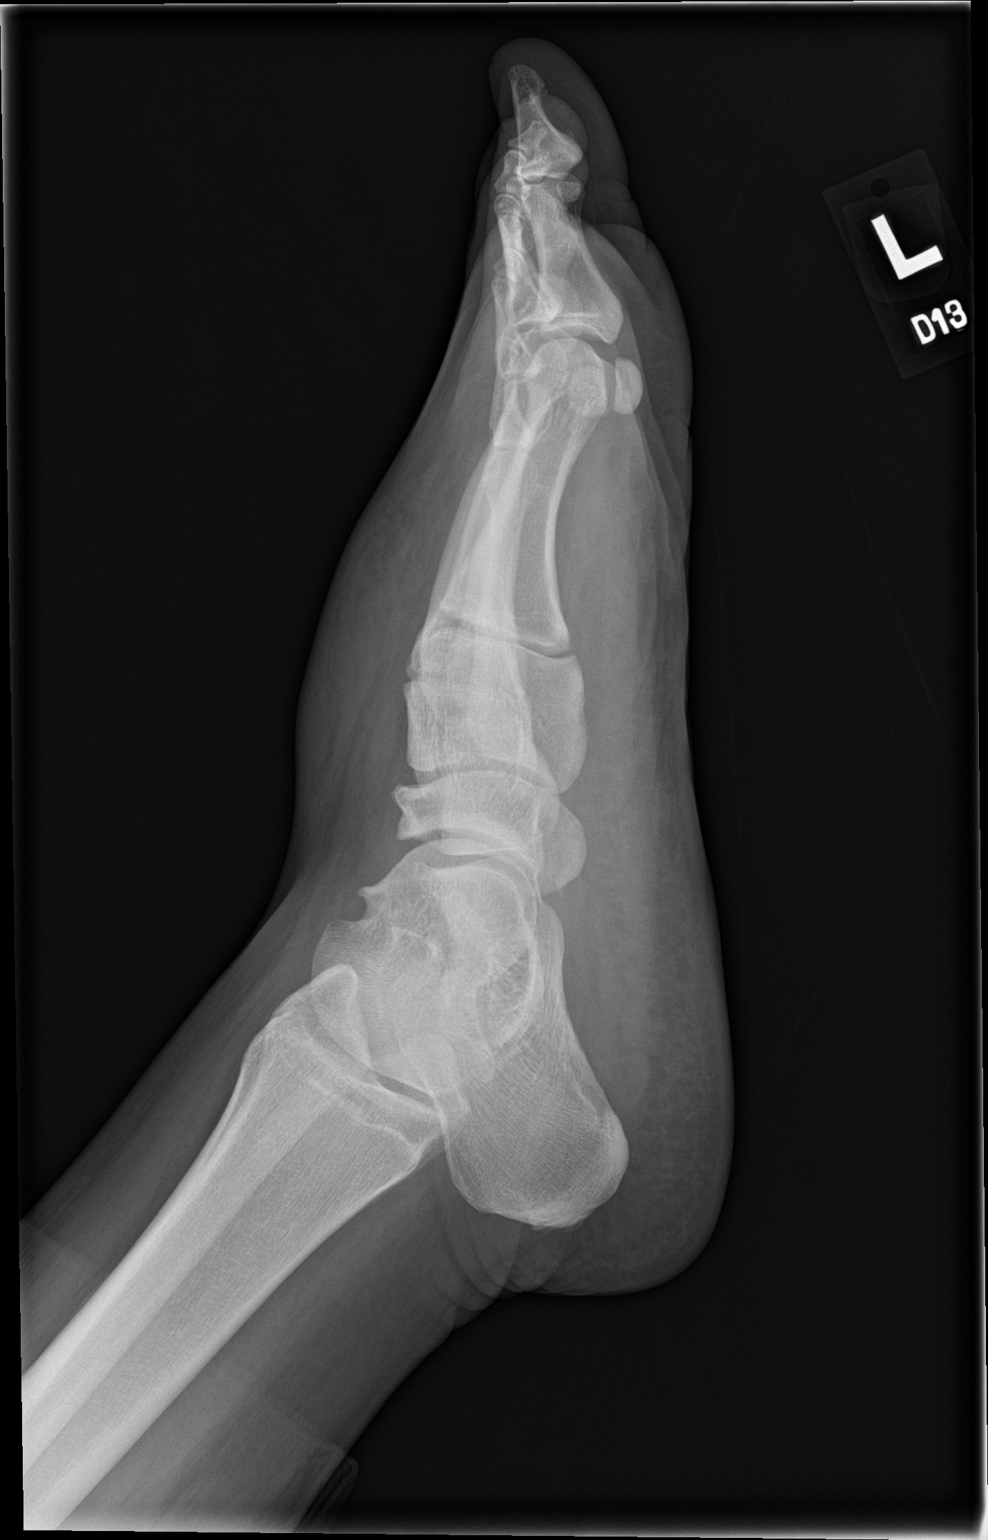

[3 of 3 positions shown; findings below may reference images not displayed]

FINDINGS: Dorsal soft tissue swelling evident. No acute fracture. No
subluxation or dislocation. No radiopaque soft tissue foreign body.
IMPRESSION: Dorsal soft tissue swelling without evidence of an acute fracture.

## 2020-10-05 ENCOUNTER — Other Ambulatory Visit: Payer: Self-pay | Admitting: Family Medicine

## 2020-10-05 DIAGNOSIS — E11649 Type 2 diabetes mellitus with hypoglycemia without coma: Secondary | ICD-10-CM

## 2021-07-24 ENCOUNTER — Other Ambulatory Visit: Payer: Self-pay | Admitting: Family Medicine

## 2021-07-24 DIAGNOSIS — E11649 Type 2 diabetes mellitus with hypoglycemia without coma: Secondary | ICD-10-CM

## 2021-09-24 ENCOUNTER — Telehealth: Payer: Self-pay

## 2021-09-24 NOTE — Telephone Encounter (Signed)
NOTES SCANNED TO REFERRAL 

## 2021-10-11 ENCOUNTER — Encounter: Payer: Self-pay | Admitting: *Deleted
